# Patient Record
Sex: Male | Born: 1940 | Race: White | Hispanic: No | Marital: Married | Smoking: Current every day smoker
Health system: Southern US, Community
[De-identification: ages and names within clinical notes are randomized; demographics above are authoritative.]

## PROBLEM LIST (undated history)

## (undated) DIAGNOSIS — I213 ST elevation (STEMI) myocardial infarction of unspecified site: Secondary | ICD-10-CM

## (undated) DIAGNOSIS — I10 Essential (primary) hypertension: Secondary | ICD-10-CM

## (undated) DIAGNOSIS — J449 Chronic obstructive pulmonary disease, unspecified: Secondary | ICD-10-CM

## (undated) HISTORY — PX: CORONARY ARTERY BYPASS GRAFT: SHX141

## (undated) HISTORY — PX: CATARACT EXTRACTION: SUR2

---

## 2014-06-19 DIAGNOSIS — E7800 Pure hypercholesterolemia, unspecified: Secondary | ICD-10-CM | POA: Insufficient documentation

## 2014-06-19 DIAGNOSIS — I10 Essential (primary) hypertension: Secondary | ICD-10-CM | POA: Insufficient documentation

## 2017-09-14 DIAGNOSIS — Z720 Tobacco use: Secondary | ICD-10-CM | POA: Insufficient documentation

## 2017-12-28 DIAGNOSIS — J984 Other disorders of lung: Secondary | ICD-10-CM | POA: Insufficient documentation

## 2018-06-21 DIAGNOSIS — J449 Chronic obstructive pulmonary disease, unspecified: Secondary | ICD-10-CM | POA: Insufficient documentation

## 2019-12-18 ENCOUNTER — Inpatient Hospital Stay (HOSPITAL_COMMUNITY)
Admission: EM | Admit: 2019-12-18 | Discharge: 2019-12-21 | DRG: 065 | Disposition: A | Discharge status: Home Health | Payer: Medicare HMO | Attending: Internal Medicine | Admitting: Internal Medicine

## 2019-12-18 DIAGNOSIS — I639 Cerebral infarction, unspecified: Secondary | ICD-10-CM | POA: Diagnosis present

## 2019-12-18 DIAGNOSIS — I63512 Cerebral infarction due to unspecified occlusion or stenosis of left middle cerebral artery: Principal | ICD-10-CM | POA: Diagnosis present

## 2019-12-18 DIAGNOSIS — I2510 Atherosclerotic heart disease of native coronary artery without angina pectoris: Secondary | ICD-10-CM | POA: Diagnosis present

## 2019-12-18 DIAGNOSIS — J449 Chronic obstructive pulmonary disease, unspecified: Secondary | ICD-10-CM | POA: Diagnosis present

## 2019-12-18 DIAGNOSIS — I472 Ventricular tachycardia: Secondary | ICD-10-CM | POA: Diagnosis present

## 2019-12-18 DIAGNOSIS — Z79899 Other long term (current) drug therapy: Secondary | ICD-10-CM

## 2019-12-18 DIAGNOSIS — Z20828 Contact with and (suspected) exposure to other viral communicable diseases: Secondary | ICD-10-CM | POA: Diagnosis present

## 2019-12-18 DIAGNOSIS — I252 Old myocardial infarction: Secondary | ICD-10-CM

## 2019-12-18 DIAGNOSIS — Z6830 Body mass index (BMI) 30.0-30.9, adult: Secondary | ICD-10-CM

## 2019-12-18 DIAGNOSIS — I110 Hypertensive heart disease with heart failure: Secondary | ICD-10-CM | POA: Diagnosis present

## 2019-12-18 DIAGNOSIS — F17210 Nicotine dependence, cigarettes, uncomplicated: Secondary | ICD-10-CM | POA: Diagnosis present

## 2019-12-18 DIAGNOSIS — I10 Essential (primary) hypertension: Secondary | ICD-10-CM | POA: Diagnosis present

## 2019-12-18 DIAGNOSIS — R29707 NIHSS score 7: Secondary | ICD-10-CM | POA: Diagnosis present

## 2019-12-18 DIAGNOSIS — I255 Ischemic cardiomyopathy: Secondary | ICD-10-CM | POA: Diagnosis present

## 2019-12-18 DIAGNOSIS — E669 Obesity, unspecified: Secondary | ICD-10-CM | POA: Diagnosis present

## 2019-12-18 DIAGNOSIS — R4701 Aphasia: Secondary | ICD-10-CM | POA: Diagnosis present

## 2019-12-18 DIAGNOSIS — E785 Hyperlipidemia, unspecified: Secondary | ICD-10-CM | POA: Diagnosis present

## 2019-12-18 DIAGNOSIS — Z7951 Long term (current) use of inhaled steroids: Secondary | ICD-10-CM

## 2019-12-18 DIAGNOSIS — I5022 Chronic systolic (congestive) heart failure: Secondary | ICD-10-CM | POA: Diagnosis present

## 2019-12-18 DIAGNOSIS — Z951 Presence of aortocoronary bypass graft: Secondary | ICD-10-CM

## 2019-12-18 DIAGNOSIS — Z7982 Long term (current) use of aspirin: Secondary | ICD-10-CM

## 2019-12-18 HISTORY — DX: Essential (primary) hypertension: I10

## 2019-12-18 HISTORY — DX: ST elevation (STEMI) myocardial infarction of unspecified site: I21.3

## 2019-12-18 HISTORY — DX: Chronic obstructive pulmonary disease, unspecified: J44.9

## 2019-12-18 LAB — I-STAT CHEM 8, ED
BUN: 31 mg/dL — ABNORMAL HIGH (ref 8–23)
Calcium, Ion: 1.07 mmol/L — ABNORMAL LOW (ref 1.15–1.40)
Chloride: 104 mmol/L (ref 98–111)
Creatinine, Ser: 1.1 mg/dL (ref 0.61–1.24)
Glucose, Bld: 99 mg/dL (ref 70–99)
HCT: 51 % (ref 39.0–52.0)
Hemoglobin: 17.3 g/dL — ABNORMAL HIGH (ref 13.0–17.0)
Potassium: 4.4 mmol/L (ref 3.5–5.1)
Sodium: 136 mmol/L (ref 135–145)
TCO2: 25 mmol/L (ref 22–32)

## 2019-12-18 LAB — CBG MONITORING, ED: Glucose-Capillary: 100 mg/dL — ABNORMAL HIGH (ref 70–99)

## 2019-12-18 MED ORDER — SODIUM CHLORIDE 0.9% FLUSH
3.0000 mL | Freq: Once | INTRAVENOUS | Status: AC
  Administered 2019-12-19: 3 mL via INTRAVENOUS

## 2019-12-18 NOTE — Consult Note (Signed)
Referring Physician: Dr. Roxanne Mins    Chief Complaint: Acute onset of expressive and receptive aphasia  HPI: Raymond Vance is an 78 y.o. male presenting to the ED via EMS with acute onset of aphasia. LKN was 1500, when he started to exhibit slight confusion per wife. This evening, she noted that he was unable to speak and called EMS. On EMS arrival he was mute but could move all 4 extremities equally, had no facial droop and was able to ambulate to the stretcher. On arrival to the ED he continued to be mute; receptive aphasia was also noted.   Per his wife, he is not on a blood thinner. He has no history of stroke. He has a prior history of MI.    LSN: 1500 tPA Given: No: Out of time window   PMHx/PSHx HTN COPD MI CABG Cataract surgery approximately 2 weeks ago  All NKDA  SHx Probable smoker  FHx Unknown  Home Meds Albuterol HCTZ Lisinopril Metoprolol Omeprazole Spiriva Sprironalactone   ROS: Unable to obtain due to aphasia.   Physical Examination: There were no vitals taken for this visit.   HEENT: [place]/AT Lungs: Respirations unlabored Ext: No edema  Neurologic Examination: Mental Status: Awake and alert. Dense expressive aphasia - mute with no verbal output. Has severe receptive aphasia - comprehends about 20% of simple verbal commands, often requiring repetition. No hemineglect.  Cranial Nerves: II:  Blinks to threat temporal visual field of each eye. PERRL. III,IV, VI: No ptosis. EOMI without nystagmus.  V,VII: Smile symmetric, reacts to tactile stimulation bilaterally  VIII: hearing intact to some commands IX,X: Unable to visualize palate XI: Head is midline XII: midline tongue extension  Motor: Right : Upper extremity   5/5    Left:     Upper extremity   5/5  Lower extremity   5/5     Lower extremity   5/5 No pronator drift.  Sensory: Reacts to pinch in BUE. Reacts to plantar stimulation BLE.  Deep Tendon Reflexes:  2+ bilateral brachioradialis and  biceps Trace patellar reflexes bilaterally 0 achilles bilaterally  Toes downgoing bilaterally  Cerebellar: No ataxia with finger to nose bilaterally  Gait: Unable to assess  Results for orders placed or performed during the hospital encounter of 12/18/19 (from the past 48 hour(s))  CBG monitoring, ED     Status: Abnormal   Collection Time: 12/18/19 11:50 PM  Result Value Ref Range   Glucose-Capillary 100 (H) 70 - 99 mg/dL   No results found.  Assessment: 78 y.o. male presenting with acute onset of expressive > receptive aphasia.  1. 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 ASPECTS 8. 2. Exam reveals dense expressive aphasia, with severe receptive aphasia. No limb weakness, facial droop, visual field cut, hemineglect or sensory deficit noted.  3. Stroke Risk Factors - HTN and prior MI 4. CTA head/neck with no LVO 5. Not a tPA candidate due to time criteria. Not a VIR candidate as CTA is negative for LVO.   Plan: 1. HgbA1c, fasting lipid panel 2. MRI brain 3. PT consult, OT consult, Speech consult 4. Echocardiogram 5. Start ASA 81 mg po qd. Give 650 mg PO loading dose now. If unable to take PO give 600 mg ASA per rectum now.  6. Most likely risks of statin outweigh potential benefits, due to advanced age.  7. Permissive HTN x 24 hours. Given advanced age, use modified protocol with treatment if SBP > 200 8. Telemetry monitoring 9. Frequent neuro checks   @  Electronically signed: Dr. Caryl Pina 12/18/2019, 11:57 PM

## 2019-12-19 ENCOUNTER — Emergency Department (HOSPITAL_COMMUNITY): Payer: Medicare HMO

## 2019-12-19 ENCOUNTER — Other Ambulatory Visit: Payer: Self-pay

## 2019-12-19 ENCOUNTER — Inpatient Hospital Stay (HOSPITAL_COMMUNITY): Payer: Medicare HMO

## 2019-12-19 ENCOUNTER — Encounter (HOSPITAL_COMMUNITY): Payer: Self-pay

## 2019-12-19 ENCOUNTER — Other Ambulatory Visit (HOSPITAL_COMMUNITY): Payer: Self-pay

## 2019-12-19 DIAGNOSIS — J449 Chronic obstructive pulmonary disease, unspecified: Secondary | ICD-10-CM | POA: Diagnosis present

## 2019-12-19 DIAGNOSIS — I6389 Other cerebral infarction: Secondary | ICD-10-CM | POA: Diagnosis not present

## 2019-12-19 DIAGNOSIS — Z951 Presence of aortocoronary bypass graft: Secondary | ICD-10-CM | POA: Diagnosis not present

## 2019-12-19 DIAGNOSIS — E7800 Pure hypercholesterolemia, unspecified: Secondary | ICD-10-CM | POA: Diagnosis not present

## 2019-12-19 DIAGNOSIS — I2510 Atherosclerotic heart disease of native coronary artery without angina pectoris: Secondary | ICD-10-CM | POA: Diagnosis present

## 2019-12-19 DIAGNOSIS — I10 Essential (primary) hypertension: Secondary | ICD-10-CM | POA: Diagnosis not present

## 2019-12-19 DIAGNOSIS — Z6830 Body mass index (BMI) 30.0-30.9, adult: Secondary | ICD-10-CM | POA: Diagnosis not present

## 2019-12-19 DIAGNOSIS — I63512 Cerebral infarction due to unspecified occlusion or stenosis of left middle cerebral artery: Secondary | ICD-10-CM | POA: Diagnosis present

## 2019-12-19 DIAGNOSIS — I5022 Chronic systolic (congestive) heart failure: Secondary | ICD-10-CM | POA: Diagnosis present

## 2019-12-19 DIAGNOSIS — I110 Hypertensive heart disease with heart failure: Secondary | ICD-10-CM | POA: Diagnosis present

## 2019-12-19 DIAGNOSIS — Z20828 Contact with and (suspected) exposure to other viral communicable diseases: Secondary | ICD-10-CM | POA: Diagnosis present

## 2019-12-19 DIAGNOSIS — I491 Atrial premature depolarization: Secondary | ICD-10-CM | POA: Diagnosis not present

## 2019-12-19 DIAGNOSIS — I255 Ischemic cardiomyopathy: Secondary | ICD-10-CM | POA: Diagnosis present

## 2019-12-19 DIAGNOSIS — R29707 NIHSS score 7: Secondary | ICD-10-CM | POA: Diagnosis present

## 2019-12-19 DIAGNOSIS — I63412 Cerebral infarction due to embolism of left middle cerebral artery: Secondary | ICD-10-CM | POA: Diagnosis not present

## 2019-12-19 DIAGNOSIS — E785 Hyperlipidemia, unspecified: Secondary | ICD-10-CM | POA: Diagnosis present

## 2019-12-19 DIAGNOSIS — I472 Ventricular tachycardia: Secondary | ICD-10-CM | POA: Diagnosis present

## 2019-12-19 DIAGNOSIS — F17210 Nicotine dependence, cigarettes, uncomplicated: Secondary | ICD-10-CM | POA: Diagnosis present

## 2019-12-19 DIAGNOSIS — I252 Old myocardial infarction: Secondary | ICD-10-CM | POA: Diagnosis not present

## 2019-12-19 DIAGNOSIS — Z79899 Other long term (current) drug therapy: Secondary | ICD-10-CM | POA: Diagnosis not present

## 2019-12-19 DIAGNOSIS — I639 Cerebral infarction, unspecified: Secondary | ICD-10-CM | POA: Diagnosis present

## 2019-12-19 DIAGNOSIS — R4701 Aphasia: Secondary | ICD-10-CM | POA: Diagnosis present

## 2019-12-19 DIAGNOSIS — I493 Ventricular premature depolarization: Secondary | ICD-10-CM | POA: Diagnosis not present

## 2019-12-19 DIAGNOSIS — Z7982 Long term (current) use of aspirin: Secondary | ICD-10-CM | POA: Diagnosis not present

## 2019-12-19 DIAGNOSIS — Z7951 Long term (current) use of inhaled steroids: Secondary | ICD-10-CM | POA: Diagnosis not present

## 2019-12-19 DIAGNOSIS — E669 Obesity, unspecified: Secondary | ICD-10-CM | POA: Diagnosis present

## 2019-12-19 LAB — ECHOCARDIOGRAM COMPLETE
Height: 71 in
Weight: 3449.76 oz

## 2019-12-19 LAB — LIPID PANEL
Cholesterol: 179 mg/dL (ref 0–200)
HDL: 50 mg/dL (ref >40)
LDL Cholesterol: 112 mg/dL — ABNORMAL HIGH (ref 0–99)
Total CHOL/HDL Ratio: 3.6 RATIO
Triglycerides: 87 mg/dL (ref <150)
VLDL: 17 mg/dL (ref 0–40)

## 2019-12-19 LAB — CBC
HCT: 51.7 % (ref 39.0–52.0)
Hemoglobin: 16.9 g/dL (ref 13.0–17.0)
MCH: 30.5 pg (ref 26.0–34.0)
MCHC: 32.7 g/dL (ref 30.0–36.0)
MCV: 93.2 fL (ref 80.0–100.0)
Platelets: 169 10*3/uL (ref 150–400)
RBC: 5.55 MIL/uL (ref 4.22–5.81)
RDW: 13.4 % (ref 11.5–15.5)
WBC: 7.5 10*3/uL (ref 4.0–10.5)
nRBC: 0 % (ref 0.0–0.2)

## 2019-12-19 LAB — DIFFERENTIAL
Abs Immature Granulocytes: 0.02 10*3/uL (ref 0.00–0.07)
Basophils Absolute: 0.1 10*3/uL (ref 0.0–0.1)
Basophils Relative: 1 %
Eosinophils Absolute: 0.2 10*3/uL (ref 0.0–0.5)
Eosinophils Relative: 3 %
Immature Granulocytes: 0 %
Lymphocytes Relative: 33 %
Lymphs Abs: 2.4 10*3/uL (ref 0.7–4.0)
Monocytes Absolute: 0.6 10*3/uL (ref 0.1–1.0)
Monocytes Relative: 8 %
Neutro Abs: 4.2 10*3/uL (ref 1.7–7.7)
Neutrophils Relative %: 55 %

## 2019-12-19 LAB — COMPREHENSIVE METABOLIC PANEL
ALT: 19 U/L (ref 0–44)
AST: 16 U/L (ref 15–41)
Albumin: 3.8 g/dL (ref 3.5–5.0)
Alkaline Phosphatase: 50 U/L (ref 38–126)
Anion gap: 12 (ref 5–15)
BUN: 28 mg/dL — ABNORMAL HIGH (ref 8–23)
CO2: 21 mmol/L — ABNORMAL LOW (ref 22–32)
Calcium: 9.1 mg/dL (ref 8.9–10.3)
Chloride: 103 mmol/L (ref 98–111)
Creatinine, Ser: 1.24 mg/dL (ref 0.61–1.24)
GFR calc Af Amer: >60 mL/min (ref >60)
GFR calc non Af Amer: 55 mL/min — ABNORMAL LOW (ref >60)
Glucose, Bld: 103 mg/dL — ABNORMAL HIGH (ref 70–99)
Potassium: 4.5 mmol/L (ref 3.5–5.1)
Sodium: 136 mmol/L (ref 135–145)
Total Bilirubin: 0.7 mg/dL (ref 0.3–1.2)
Total Protein: 6.5 g/dL (ref 6.5–8.1)

## 2019-12-19 LAB — PROTIME-INR
INR: 1 (ref 0.8–1.2)
Prothrombin Time: 12.5 seconds (ref 11.4–15.2)

## 2019-12-19 LAB — RESPIRATORY PANEL BY RT PCR (FLU A&B, COVID)
Influenza A by PCR: NEGATIVE
Influenza B by PCR: NEGATIVE
SARS Coronavirus 2 by RT PCR: NEGATIVE

## 2019-12-19 LAB — APTT: aPTT: 25 seconds (ref 24–36)

## 2019-12-19 LAB — HEMOGLOBIN A1C
Hgb A1c MFr Bld: 6.2 % — ABNORMAL HIGH (ref 4.8–5.6)
Mean Plasma Glucose: 131.24 mg/dL

## 2019-12-19 MED ORDER — CHLORHEXIDINE GLUCONATE 0.12 % MT SOLN
15.0000 mL | Freq: Two times a day (BID) | OROMUCOSAL | Status: DC
  Administered 2019-12-19 – 2019-12-21 (×5): 15 mL via OROMUCOSAL
  Filled 2019-12-19 (×5): qty 15

## 2019-12-19 MED ORDER — ORAL CARE MOUTH RINSE: 15.0000 mL | Freq: Two times a day (BID) | OROMUCOSAL | Status: DC

## 2019-12-19 MED ORDER — ASPIRIN 300 MG RE SUPP
600.0000 mg | Freq: Once | RECTAL | Status: AC
  Administered 2019-12-19: 600 mg via RECTAL
  Filled 2019-12-19: qty 2

## 2019-12-19 MED ORDER — PERFLUTREN LIPID MICROSPHERE
INTRAVENOUS | Status: AC
  Filled 2019-12-19: qty 10

## 2019-12-19 MED ORDER — IOHEXOL 350 MG/ML SOLN
100.0000 mL | Freq: Once | INTRAVENOUS | Status: AC | PRN
  Administered 2019-12-19: 100 mL via INTRAVENOUS

## 2019-12-19 MED ORDER — ACETAMINOPHEN 325 MG PO TABS: 650.0000 mg | ORAL_TABLET | ORAL | Status: DC | PRN

## 2019-12-19 MED ORDER — ACETAMINOPHEN 650 MG RE SUPP: 650.0000 mg | RECTAL | Status: DC | PRN

## 2019-12-19 MED ORDER — PERFLUTREN LIPID MICROSPHERE
1.0000 mL | INTRAVENOUS | Status: AC | PRN
  Administered 2019-12-19: 2 mL via INTRAVENOUS
  Filled 2019-12-19: qty 10

## 2019-12-19 MED ORDER — SODIUM CHLORIDE 0.9 % IV SOLN: INTRAVENOUS | Status: DC

## 2019-12-19 MED ORDER — ACETAMINOPHEN 160 MG/5ML PO SOLN: 650.0000 mg | ORAL | Status: DC | PRN

## 2019-12-19 MED ORDER — SODIUM CHLORIDE 0.9 % IV SOLN: Freq: Once | INTRAVENOUS | Status: AC

## 2019-12-19 MED ORDER — ORAL CARE MOUTH RINSE
15.0000 mL | Freq: Two times a day (BID) | OROMUCOSAL | Status: DC
  Administered 2019-12-19 – 2019-12-21 (×5): 15 mL via OROMUCOSAL

## 2019-12-19 MED ORDER — ASPIRIN EC 325 MG PO TBEC
325.0000 mg | DELAYED_RELEASE_TABLET | Freq: Every day | ORAL | Status: DC
  Administered 2019-12-20 – 2019-12-21 (×2): 325 mg via ORAL
  Filled 2019-12-19 (×2): qty 1

## 2019-12-19 MED ORDER — STROKE: EARLY STAGES OF RECOVERY BOOK: Freq: Once | Status: AC

## 2019-12-19 MED ORDER — STROKE: EARLY STAGES OF RECOVERY BOOK
Status: AC
  Filled 2019-12-19: qty 1

## 2019-12-19 MED ORDER — ENOXAPARIN SODIUM 40 MG/0.4ML ~~LOC~~ SOLN
40.0000 mg | SUBCUTANEOUS | Status: DC
  Administered 2019-12-19 – 2019-12-21 (×3): 40 mg via SUBCUTANEOUS
  Filled 2019-12-19 (×3): qty 0.4

## 2019-12-19 MED ORDER — ASPIRIN EC 81 MG PO TBEC: 81.0000 mg | DELAYED_RELEASE_TABLET | Freq: Every day | ORAL | Status: DC

## 2019-12-19 MED ORDER — ATORVASTATIN CALCIUM 40 MG PO TABS
40.0000 mg | ORAL_TABLET | Freq: Every day | ORAL | Status: DC
  Administered 2019-12-19 – 2019-12-20 (×2): 40 mg via ORAL
  Filled 2019-12-19 (×2): qty 1

## 2019-12-19 NOTE — Progress Notes (Signed)
  Echocardiogram 2D Echocardiogram has been performed.  Bobbye Charleston 12/19/2019, 11:45 AM

## 2019-12-19 NOTE — Progress Notes (Signed)
PROGRESS NOTE    Raymond Vance  ZOX:[id number] DOB: 10-18-1941 DOA: 12/18/2019 PCP: Wilburn Mylar, MD   Brief Narrative: Raymond Vance is a 78 y.o. male with medical history significant of HTN, CAD s/p CABG. Patient presented secondary to aphasia and found to have an acute stroke.   Assessment & Plan:   Principal Problem:   Acute ischemic stroke [facility]) Active Problems:   HTN (hypertension)   CAD (coronary artery disease)   Severe aphasia   Acute CVA Resultant severe aphasia. CT head significant for acute/early subacute ischemia of anterior left MCA distribution. MRI significant for intermediate sized acute infarct of left frontal operculum. LDL of 112, hemoglobin A1C of 6.2, Transthoracic Echocardiogram pending. Patient started on aspirin. Neurology consulted. OT recommending CIR -Neurology recommendations: pending -Continue aspirin -PT/SLP pending -Transthoracic Echocardiogram -IV fluids  Essential hypertension Will await completion of medication reconciliation  CAD Hyperlipidemia Medication reconciliation pending   DVT prophylaxis: Lovenox Code Status:   Code Status: Full Code Family Communication: None Disposition Plan: Discharge pending stroke workup and therapy recommendations   Consultants:   Neurology  Procedures:   None  Antimicrobials:  None    Subjective: Aphasic. Patient shakes his head and nods. No questions. No weakness  Objective: Vitals:   12/19/19 0852 12/19/19 1029 12/19/19 1051 12/19/19 1157  BP: 111/60 120/63 133/69 122/65  Pulse:  (!) 57 (!) 54 (!) 57  Resp: 19 (!) 21 18 20   Temp:      TempSrc:      SpO2:  97% 96% 97%  Weight:      Height:        Intake/Output Summary (Last 24 hours) at 12/19/2019 1214 Last data filed at 12/19/2019 0600 Gross per 24 hour  Intake 562.45 ml  Output 400 ml  Net 162.45 ml   Filed Weights   12/19/19 0108 12/19/19 0359  Weight: 99.2 kg 97.8 kg    Examination:  General exam: Appears  calm and comfortable Respiratory system: Clear to auscultation. Respiratory effort normal. Cardiovascular system: S1 & S2 heard, RRR. No murmurs, rubs, gallops or clicks. Gastrointestinal system: Abdomen is nondistended, soft and nontender. No organomegaly or masses felt. Normal bowel sounds heard. Central nervous system: Alert and oriented. Upper and lower extremity weakness bilaterally. Expressive aphasia Extremities: No edema. No calf tenderness Skin: No cyanosis. No rashes Psychiatry: Judgement and insight appear normal. Mood & affect appropriate.    Data Reviewed: I have personally reviewed following labs and imaging studies  CBC: Recent Labs  Lab 12/18/19 2351 12/18/19 2356  WBC 7.5  --   NEUTROABS 4.2  --   HGB 16.9 17.3*  HCT 51.7 51.0  MCV 93.2  --   PLT 169  --    Basic Metabolic Panel: Recent Labs  Lab 12/18/19 2351 12/18/19 2356  NA 136 136  K 4.5 4.4  CL 103 104  CO2 21*  --   GLUCOSE 103* 99  BUN 28* 31*  CREATININE 1.24 1.10  CALCIUM 9.1  --    GFR: Estimated Creatinine Clearance: 64.9 mL/min (by C-G formula based on SCr of 1.1 mg/dL). Liver Function Tests: Recent Labs  Lab 12/18/19 2351  AST 16  ALT 19  ALKPHOS 50  BILITOT 0.7  PROT 6.5  ALBUMIN 3.8   No results for input(s): LIPASE, AMYLASE in the last 168 hours. No results for input(s): AMMONIA in the last 168 hours. Coagulation Profile: Recent Labs  Lab 12/18/19 2351  INR 1.0   Cardiac Enzymes: No results for  input(s): CKTOTAL, CKMB, CKMBINDEX, TROPONINI in the last 168 hours. BNP (last 3 results) No results for input(s): PROBNP in the last 8760 hours. HbA1C: Recent Labs    12/19/19 0332  HGBA1C 6.2*   CBG: Recent Labs  Lab 12/18/19 2350  GLUCAP 100*   Lipid Profile: Recent Labs    12/19/19 0332  CHOL 179  HDL 50  LDLCALC 112*  TRIG 87  CHOLHDL 3.6   Thyroid Function Tests: No results for input(s): TSH, T4TOTAL, FREET4, T3FREE, THYROIDAB in the last 72  hours. Anemia Panel: No results for input(s): VITAMINB12, FOLATE, FERRITIN, TIBC, IRON, RETICCTPCT in the last 72 hours. Sepsis Labs: No results for input(s): PROCALCITON, LATICACIDVEN in the last 168 hours.  Recent Results (from the past 240 hour(s))  Respiratory Panel by RT PCR (Flu A&B, Covid) - Nasopharyngeal Swab     Status: None   Collection Time: 12/19/19 12:44 AM   Specimen: Nasopharyngeal Swab  Result Value Ref Range Status   SARS Coronavirus 2 by RT PCR NEGATIVE NEGATIVE Final    Comment: (NOTE) SARS-CoV-2 target nucleic acids are NOT DETECTED. The SARS-CoV-2 RNA is generally detectable in upper respiratoy specimens during the acute phase of infection. The lowest concentration of SARS-CoV-2 viral copies this assay can detect is 131 copies/mL. A negative result does not preclude SARS-Cov-2 infection and should not be used as the sole basis for treatment or other patient management decisions. A negative result may occur with  improper specimen collection/handling, submission of specimen other than nasopharyngeal swab, presence of viral mutation(s) within the areas targeted by this assay, and inadequate number of viral copies (<131 copies/mL). A negative result must be combined with clinical observations, patient history, and epidemiological information. The expected result is Negative. Fact Sheet for Patients:  https://www.moore.com/ Fact Sheet for Healthcare Providers:  https://www.young.biz/ This test is not yet ap proved or cleared by the [place] FDA and  has been authorized for detection and/or diagnosis of SARS-CoV-2 by FDA under an Emergency Use Authorization (EUA). This EUA will remain  in effect (meaning this test can be used) for the duration of the COVID-19 declaration under Section 564(b)(1) of the Act, 21 U.S.C. section 360bbb-3(b)(1), unless the authorization is terminated or revoked sooner.    Influenza A by PCR  NEGATIVE NEGATIVE Final   Influenza B by PCR NEGATIVE NEGATIVE Final    Comment: (NOTE) The Xpert Xpress SARS-CoV-2/FLU/RSV assay is intended as an aid in  the diagnosis of influenza from Nasopharyngeal swab specimens and  should not be used as a sole basis for treatment. Nasal washings and  aspirates are unacceptable for Xpert Xpress SARS-CoV-2/FLU/RSV  testing. Fact Sheet for Patients: https://www.moore.com/ Fact Sheet for Healthcare Providers: https://www.young.biz/ This test is not yet approved or cleared by the [place] FDA and  has been authorized for detection and/or diagnosis of SARS-CoV-2 by  FDA under an Emergency Use Authorization (EUA). This EUA will remain  in effect (meaning this test can be used) for the duration of the  Covid-19 declaration under Section 564(b)(1) of the Act, 21  U.S.C. section 360bbb-3(b)(1), unless the authorization is  terminated or revoked. Performed at [facility], [address], [place], [place] [address]          Radiology Studies: CT Code Stroke CTA Head W/WO contrast  Result Date: 12/19/2019 CLINICAL DATA:  Aphasia EXAM: CT ANGIOGRAPHY HEAD AND NECK CT PERFUSION BRAIN TECHNIQUE: Multidetector CT imaging of the head and neck was performed using the standard protocol  during bolus administration of intravenous contrast. Multiplanar CT image reconstructions and MIPs were obtained to evaluate the vascular anatomy. Carotid stenosis measurements (when applicable) are obtained utilizing NASCET criteria, using the distal internal carotid diameter as the denominator. Multiphase CT imaging of the brain was performed following IV bolus contrast injection. Subsequent parametric perfusion maps were calculated using RAPID software. CONTRAST:  OMNIPAQUE IOHEXOL 350 MG/ML SOLN COMPARISON:  None. FINDINGS: CTA NECK FINDINGS SKELETON: There is no bony spinal canal stenosis. No lytic or blastic lesion.  OTHER NECK: Normal pharynx, larynx and major salivary glands. No cervical lymphadenopathy. Unremarkable thyroid gland. UPPER CHEST: No pneumothorax or pleural effusion. No nodules or masses. AORTIC ARCH: There is no calcific atherosclerosis of the aortic arch. There is no aneurysm, dissection or hemodynamically significant stenosis of the visualized portion of the aorta. Conventional 3 vessel aortic branching pattern. The visualized proximal subclavian arteries are widely patent. RIGHT CAROTID SYSTEM: No dissection, occlusion or aneurysm. Mild atherosclerotic calcification at the carotid bifurcation without hemodynamically significant stenosis. LEFT CAROTID SYSTEM: No dissection, occlusion or aneurysm. Mild atherosclerotic calcification at the carotid bifurcation without hemodynamically significant stenosis. VERTEBRAL ARTERIES: Left dominant configuration. Moderate calcification of the left vertebral artery origin. There is no dissection, occlusion or flow-limiting stenosis to the skull base (V1-V3 segments). CTA HEAD FINDINGS POSTERIOR CIRCULATION: --Vertebral arteries: Bilateral atherosclerotic calcification of the V4 segments with severe stenosis on the right. No stenosis on the left. --Posterior inferior cerebellar arteries (PICA): Patent origins from the vertebral arteries. --Anterior inferior cerebellar arteries (AICA): Patent origins from the basilar artery. --Basilar artery: Normal. --Superior cerebellar arteries: Normal. --Posterior cerebral arteries: Normal. Both originate from the basilar artery. Posterior communicating arteries (p-comm) are diminutive or absent. ANTERIOR CIRCULATION: --Intracranial internal carotid arteries: Atherosclerotic calcification of the internal carotid arteries at the skull base without hemodynamically significant stenosis. --Anterior cerebral arteries (ACA): Normal. Both A1 segments are present. Patent anterior communicating artery (a-comm). --Middle cerebral arteries (MCA):  There is moderate stenosis of the distal right MCA M1 segment. There is no proximal occlusion. There is mild narrowing of the distal left M1 segment. The M2 branches are patent. There is tapering of an M3 branch in the area of the frontal operculum infarct. VENOUS SINUSES: As permitted by contrast timing, patent. ANATOMIC VARIANTS: None Review of the MIP images confirms the above findings. CT Brain Perfusion Findings: ASPECTS: 8 CBF (<30%) Volume: 11mL Perfusion (Tmax>6.0s) volume: 36mL Mismatch Volume: 25mL Infarction Location:Left frontal operculum IMPRESSION: 1. No emergent large vessel occlusion. 2. 11 mL core infarct in the left frontal operculum with surrounding 25 mL ischemic penumbra. 3. Mild narrowing of the distal left M1 segment with tapering of an M3 branch in the area of the frontal operculum infarct. 4. 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 5. Mild bilateral carotid bifurcation atherosclerosis without hemodynamically significant stenosis. 6. Moderate stenosis of the distal right MCA M1 segment. These results were called by telephone at the time of interpretation on 12/19/2019 at 12:30 am to provider ERIC [facility] , who verbally acknowledged these results. Electronically Signed   By: Deatra Robinson M.D.   On: 12/19/2019 00:35   DG Chest 2 View  Result Date: 12/19/2019 CLINICAL DATA:  Infarct EXAM: CHEST - 2 VIEW COMPARISON:  None. FINDINGS: Mild cardiomegaly and remote median sternotomy. No focal airspace consolidation or pulmonary edema. No pleural effusion or pneumothorax. IMPRESSION: No active cardiopulmonary disease. Electronically Signed   By: Deatra Robinson M.D.   On: 12/19/2019 03:00  CT Code Stroke CTA Neck W/WO contrast  Result Date: 12/19/2019 CLINICAL DATA:  Aphasia EXAM: CT ANGIOGRAPHY HEAD AND NECK CT PERFUSION BRAIN TECHNIQUE: Multidetector CT imaging of the head and neck was performed using the standard protocol during bolus administration of intravenous  contrast. Multiplanar CT image reconstructions and MIPs were obtained to evaluate the vascular anatomy. Carotid stenosis measurements (when applicable) are obtained utilizing NASCET criteria, using the distal internal carotid diameter as the denominator. Multiphase CT imaging of the brain was performed following IV bolus contrast injection. Subsequent parametric perfusion maps were calculated using RAPID software. CONTRAST:  OMNIPAQUE IOHEXOL 350 MG/ML SOLN COMPARISON:  None. FINDINGS: CTA NECK FINDINGS SKELETON: There is no bony spinal canal stenosis. No lytic or blastic lesion. OTHER NECK: Normal pharynx, larynx and major salivary glands. No cervical lymphadenopathy. Unremarkable thyroid gland. UPPER CHEST: No pneumothorax or pleural effusion. No nodules or masses. AORTIC ARCH: There is no calcific atherosclerosis of the aortic arch. There is no aneurysm, dissection or hemodynamically significant stenosis of the visualized portion of the aorta. Conventional 3 vessel aortic branching pattern. The visualized proximal subclavian arteries are widely patent. RIGHT CAROTID SYSTEM: No dissection, occlusion or aneurysm. Mild atherosclerotic calcification at the carotid bifurcation without hemodynamically significant stenosis. LEFT CAROTID SYSTEM: No dissection, occlusion or aneurysm. Mild atherosclerotic calcification at the carotid bifurcation without hemodynamically significant stenosis. VERTEBRAL ARTERIES: Left dominant configuration. Moderate calcification of the left vertebral artery origin. There is no dissection, occlusion or flow-limiting stenosis to the skull base (V1-V3 segments). CTA HEAD FINDINGS POSTERIOR CIRCULATION: --Vertebral arteries: Bilateral atherosclerotic calcification of the V4 segments with severe stenosis on the right. No stenosis on the left. --Posterior inferior cerebellar arteries (PICA): Patent origins from the vertebral arteries. --Anterior inferior cerebellar arteries (AICA): Patent  origins from the basilar artery. --Basilar artery: Normal. --Superior cerebellar arteries: Normal. --Posterior cerebral arteries: Normal. Both originate from the basilar artery. Posterior communicating arteries (p-comm) are diminutive or absent. ANTERIOR CIRCULATION: --Intracranial internal carotid arteries: Atherosclerotic calcification of the internal carotid arteries at the skull base without hemodynamically significant stenosis. --Anterior cerebral arteries (ACA): Normal. Both A1 segments are present. Patent anterior communicating artery (a-comm). --Middle cerebral arteries (MCA): There is moderate stenosis of the distal right MCA M1 segment. There is no proximal occlusion. There is mild narrowing of the distal left M1 segment. The M2 branches are patent. There is tapering of an M3 branch in the area of the frontal operculum infarct. VENOUS SINUSES: As permitted by contrast timing, patent. ANATOMIC VARIANTS: None Review of the MIP images confirms the above findings. CT Brain Perfusion Findings: ASPECTS: 8 CBF (<30%) Volume: 11mL Perfusion (Tmax>6.0s) volume: 36mL Mismatch Volume: 25mL Infarction Location:Left frontal operculum IMPRESSION: 1. No emergent large vessel occlusion. 2. 11 mL core infarct in the left frontal operculum with surrounding 25 mL ischemic penumbra. 3. Mild narrowing of the distal left M1 segment with tapering of an M3 branch in the area of the frontal operculum infarct. 4. 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 5. Mild bilateral carotid bifurcation atherosclerosis without hemodynamically significant stenosis. 6. Moderate stenosis of the distal right MCA M1 segment. These results were called by telephone at the time of interpretation on 12/19/2019 at 12:30 am to provider ERIC [facility] , who verbally acknowledged these results. Electronically Signed   By: Deatra Robinson M.D.   On: 12/19/2019 00:35   MR BRAIN WO CONTRAST  Result Date: 12/19/2019 CLINICAL DATA:   Acute onset of aphasia. EXAM: MRI HEAD WITHOUT  CONTRAST TECHNIQUE: Multiplanar, multiecho pulse sequences of the brain and surrounding structures were obtained without intravenous contrast. COMPARISON:  Head CT 12/19/2019 FINDINGS: BRAIN: Intermediate sized acute infarct of the left frontal operculum. Early confluent hyperintense T2-weighted signal of the periventricular and deep white matter, most commonly due to chronic ischemic microangiopathy. 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 The cerebral and cerebellar volume are age-appropriate. There is no hydrocephalus. The midline structures are normal. VASCULAR: The major intracranial arterial and venous sinus flow voids are normal. Hemosiderin deposition at the site of old left parietal infarct. No acute hemorrhage. SKULL AND UPPER CERVICAL SPINE: Calvarial bone marrow signal is normal. There is no skull base mass. The visualized upper cervical spine and soft tissues are normal. SINUSES/ORBITS: Mild left maxillary sinus mucosal thickening. The orbits are normal. IMPRESSION: 1. Intermediate sized acute infarct of the left frontal operculum. No hemorrhage or mass effect. 2. 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 3. Moderate chronic small vessel disease. Electronically Signed   By: Deatra Robinson M.D.   On: 12/19/2019 02:49   CT Code Stroke Cerebral Perfusion with contrast  Result Date: 12/19/2019 CLINICAL DATA:  Aphasia EXAM: CT ANGIOGRAPHY HEAD AND NECK CT PERFUSION BRAIN TECHNIQUE: Multidetector CT imaging of the head and neck was performed using the standard protocol during bolus administration of intravenous contrast. Multiplanar CT image reconstructions and MIPs were obtained to evaluate the vascular anatomy. Carotid stenosis measurements (when applicable) are obtained utilizing NASCET criteria, using the distal internal carotid diameter as the denominator. Multiphase CT imaging of the brain was performed following IV  bolus contrast injection. Subsequent parametric perfusion maps were calculated using RAPID software. CONTRAST:  OMNIPAQUE IOHEXOL 350 MG/ML SOLN COMPARISON:  None. FINDINGS: CTA NECK FINDINGS SKELETON: There is no bony spinal canal stenosis. No lytic or blastic lesion. OTHER NECK: Normal pharynx, larynx and major salivary glands. No cervical lymphadenopathy. Unremarkable thyroid gland. UPPER CHEST: No pneumothorax or pleural effusion. No nodules or masses. AORTIC ARCH: There is no calcific atherosclerosis of the aortic arch. There is no aneurysm, dissection or hemodynamically significant stenosis of the visualized portion of the aorta. Conventional 3 vessel aortic branching pattern. The visualized proximal subclavian arteries are widely patent. RIGHT CAROTID SYSTEM: No dissection, occlusion or aneurysm. Mild atherosclerotic calcification at the carotid bifurcation without hemodynamically significant stenosis. LEFT CAROTID SYSTEM: No dissection, occlusion or aneurysm. Mild atherosclerotic calcification at the carotid bifurcation without hemodynamically significant stenosis. VERTEBRAL ARTERIES: Left dominant configuration. Moderate calcification of the left vertebral artery origin. There is no dissection, occlusion or flow-limiting stenosis to the skull base (V1-V3 segments). CTA HEAD FINDINGS POSTERIOR CIRCULATION: --Vertebral arteries: Bilateral atherosclerotic calcification of the V4 segments with severe stenosis on the right. No stenosis on the left. --Posterior inferior cerebellar arteries (PICA): Patent origins from the vertebral arteries. --Anterior inferior cerebellar arteries (AICA): Patent origins from the basilar artery. --Basilar artery: Normal. --Superior cerebellar arteries: Normal. --Posterior cerebral arteries: Normal. Both originate from the basilar artery. Posterior communicating arteries (p-comm) are diminutive or absent. ANTERIOR CIRCULATION: --Intracranial internal carotid arteries:  Atherosclerotic calcification of the internal carotid arteries at the skull base without hemodynamically significant stenosis. --Anterior cerebral arteries (ACA): Normal. Both A1 segments are present. Patent anterior communicating artery (a-comm). --Middle cerebral arteries (MCA): There is moderate stenosis of the distal right MCA M1 segment. There is no proximal occlusion. There is mild narrowing of the distal left M1 segment. The M2 branches are patent. There is tapering of an M3 branch in  the area of the frontal operculum infarct. VENOUS SINUSES: As permitted by contrast timing, patent. ANATOMIC VARIANTS: None Review of the MIP images confirms the above findings. CT Brain Perfusion Findings: ASPECTS: 8 CBF (<30%) Volume: 68mL Perfusion (Tmax>6.0s) volume: 73mL Mismatch Volume: 32mL Infarction Location:Left frontal operculum IMPRESSION: 1. No emergent large vessel occlusion. 2. 11 mL core infarct in the left frontal operculum with surrounding 25 mL ischemic penumbra. 3. Mild narrowing of the distal left M1 segment with tapering of an M3 branch in the area of the frontal operculum infarct. 4. 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 5. Mild bilateral carotid bifurcation atherosclerosis without hemodynamically significant stenosis. 6. Moderate stenosis of the distal right MCA M1 segment. These results were called by telephone at the time of interpretation on 12/19/2019 at 12:30 am to provider ERIC [facility] , who verbally acknowledged these results. Electronically Signed   By: Ulyses Jarred M.D.   On: 12/19/2019 00:35   ECHOCARDIOGRAM COMPLETE  Result Date: 12/19/2019   ECHOCARDIOGRAM REPORT   Patient Name:   Raymond Vance Date of Exam: 12/19/2019 Medical Rec #:  [id number]    Height:       71.0 in Accession #:    [id number]   Weight:       215.6 lb Date of Birth:  1941-10-16    BSA:          2.18 m Patient Age:    35 years     BP:           118/64 mmHg Patient Gender: M            HR:            37 bpm. Exam Location:  Inpatient Procedure: 2D Echo, Cardiac Doppler, Color Doppler and Intracardiac            Opacification Agent Indications:    Stroke  History:        Patient has no prior history of Echocardiogram examinations. CAD                 and Previous Myocardial Infarction, Prior CABG and Abnormal ECG,                 Stroke and COPD; Risk Factors:Hypertension.  Sonographer:    Roseanna Rainbow RDCS Referring Phys: [phone] JARED M GARDNER  Sonographer Comments: Technically difficult study due to poor echo windows, no subcostal window, suboptimal subcostal window and suboptimal apical window. IMPRESSIONS  1. Left ventricular ejection fraction, by visual estimation, is 20 to 25%. The left ventricle has severely decreased function. There is moderately increased left ventricular hypertrophy.  2. Definity contrast agent was given IV to delineate the left ventricular endocardial borders. No mural thrombus was noted.  3. Left ventricular diastolic function could not be evaluated.  4. The left ventricle demonstrates global hypokinesis.  5. Global right ventricle has normal systolic function.The right ventricular size is normal. No increase in right ventricular wall thickness.  6. Left atrial size was moderately dilated.  7. Right atrial size was mildly dilated.  8. The mitral valve is grossly normal. Trivial mitral valve regurgitation.  9. The tricuspid valve is grossly normal. 10. The aortic valve is tricuspid. Aortic valve regurgitation is trivial. Mild aortic valve sclerosis without stenosis. 11. The pulmonic valve was not well visualized. Pulmonic valve regurgitation is not visualized. 12. Aortic dilatation noted. 13. 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 FINDINGS  Left Ventricle:  Left ventricular ejection fraction, by visual estimation, is 20 to 25%. The left ventricle has severely decreased function. Definity contrast agent was given IV to delineate the left ventricular endocardial  borders. The left ventricle demonstrates global hypokinesis. There is moderately increased left ventricular hypertrophy. The left ventricular diastology could not be evaluated due to indeterminate diastolic function. Left ventricular diastolic function could not be evaluated. Right Ventricle: The right ventricular size is normal. No increase in right ventricular wall thickness. Global RV systolic function is has normal systolic function. Left Atrium: Left atrial size was moderately dilated. Right Atrium: Right atrial size was mildly dilated Pericardium: There is no evidence of pericardial effusion. Mitral Valve: The mitral valve is grossly normal. There is mild thickening of the mitral valve leaflet(s). Trivial mitral valve regurgitation. Tricuspid Valve: The tricuspid valve is grossly normal. Tricuspid valve regurgitation is trivial. Aortic Valve: The aortic valve is tricuspid. Aortic valve regurgitation is trivial. Mild aortic valve sclerosis is present, with no evidence of aortic valve stenosis. Aortic valve mean gradient measures 7.5 mmHg. Aortic valve peak gradient measures 15.4 mmHg. Aortic valve area, by VTI measures 4.38 cm. Pulmonic Valve: The pulmonic valve was not well visualized. Pulmonic valve regurgitation is not visualized. Pulmonic regurgitation is not visualized. Aorta: Aortic dilatation noted. 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 Venous: The inferior vena cava was not well visualized. IAS/Shunts: No atrial level shunt detected by color flow Doppler.  LEFT VENTRICLE PLAX 2D LVIDd:         6.60 cm LVIDs:         5.90 cm LV PW:         1.50 cm LV IVS:        1.60 cm LVOT diam:     2.70 cm LV SV:         50 ml LV SV Index:   22.53 LVOT Area:     5.73 cm  LV Volumes (MOD) LV area d, A4C:    52.90 cm LV area s, A4C:    44.30 cm LV major d, A4C:   9.50 cm LV major s, A4C:   8.62 cm LV vol d, MOD A4C: 226.0 ml LV vol s, MOD A4C: 175.0 ml LV SV MOD A4C:     226.0 ml RIGHT  VENTRICLE         IVC TAPSE (M-mode): 1.7 cm  IVC diam: 2.70 cm LEFT ATRIUM              Index       RIGHT ATRIUM           Index LA diam:        4.10 cm  1.88 cm/m  RA Area:     23.60 cm LA Vol (A2C):   104.0 ml 47.77 ml/m RA Volume:   77.40 ml  35.55 ml/m LA Vol (A4C):   71.6 ml  32.89 ml/m LA Biplane Vol: 90.4 ml  41.53 ml/m  AORTIC VALVE AV Area (Vmax):    4.27 cm AV Area (Vmean):   4.53 cm AV Area (VTI):     4.38 cm AV Vmax:           196.50 cm/s AV Vmean:          125.000 cm/s AV VTI:            0.412 m AV Peak Grad:      15.4 mmHg AV Mean Grad:      7.5 mmHg LVOT  Vmax:         146.50 cm/s LVOT Vmean:        98.950 cm/s LVOT VTI:          0.316 m LVOT/AV VTI ratio: 0.77  AORTA Ao Root diam: 3.60 cm Ao Asc diam:  4.00 cm MITRAL VALVE MV Area (PHT): 4.06 cm             SHUNTS MV PHT:        54.13 msec           Systemic VTI:  0.32 m MV Decel Time: 187 msec             Systemic Diam: 2.70 cm MV E velocity: 85.87 cm/s 103 cm/s MV A velocity: 83.90 cm/s 70.3 cm/s MV E/A ratio:  1.02       1.5  Zoila Shutter MD Electronically signed by Zoila Shutter MD Signature Date/Time: 12/19/2019/12:00:27 PM    Final    CT HEAD CODE STROKE WO CONTRAST  Result Date: 12/19/2019 CLINICAL DATA:  Code stroke.  Confusion and aphasia EXAM: CT HEAD WITHOUT CONTRAST TECHNIQUE: Contiguous axial images were obtained from the base of the skull through the vertex without intravenous contrast. COMPARISON:  None. FINDINGS: Brain: There is no mass, hemorrhage or extra-axial collection. The size and configuration of the ventricles and extra-axial CSF spaces are normal. There is hypoattenuation of the periventricular white matter, most commonly indicating chronic ischemic microangiopathy. There is an area of acute or early subacute ischemia within the anterior left MCA distribution, involving the left frontal operculum. 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 Vascular: No abnormal hyperdensity of the major intracranial  arteries or dural venous sinuses. No intracranial atherosclerosis. Skull: The visualized skull base, calvarium and extracranial soft tissues are normal. Sinuses/Orbits: Moderate left maxillary sinus mucosal thickening. The orbits are normal. ASPECTS [facility] Stroke Program Early CT Score) - Ganglionic level infarction (caudate, lentiform nuclei, internal capsule, insula, M1-M3 cortex): 6 - Supraganglionic infarction (M4-M6 cortex): 2 Total score (0-10 with 10 being normal): 8 IMPRESSION: 1. 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 2. No hemorrhage. 3. 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 4. ASPECTS is 8. 5. These results were communicated to Dr. Caryl Pina at 12:10 am on 12/19/2019 by text page via the [facility] messaging system. Electronically Signed   By: Deatra Robinson M.D.   On: 12/19/2019 00:12        Scheduled Meds:  [START ON 12/20/2019] aspirin EC  81 mg Oral Daily   chlorhexidine  15 mL Mouth Rinse BID   enoxaparin (LOVENOX) injection  40 mg Subcutaneous Q24H   mouth rinse  15 mL Mouth Rinse BID   perflutren lipid microspheres (DEFINITY) IV suspension       Continuous Infusions:  sodium chloride 100 mL/hr at 12/19/19 1109     LOS: 0 days     Jacquelin Hawking, MD [facility] 12/19/2019, 12:14 PM  If 7PM-7AM, please contact night-coverage [email or web address]

## 2019-12-19 NOTE — H&P (Signed)
History and Physical    Raymond Vance ATF:[id number] DOB: [birth year] DOA: 12/18/2019  PCP: Wilburn Mylar, MD  Patient coming from: Home  I have personally briefly reviewed patient's old medical records in [facility]  Chief Complaint: Aphasia  HPI: Raymond Vance is a 78 y.o. male with medical history significant of HTN, CAD s/p CABG.  Patient presents to ED via EMS with acute onset of aphasia.  LKW 1500, had slight confusion start at that time.  This evening she noticed he was unable to speak and called EMS.   ED Course: Patient mute but able to MAE.  Came in as code stroke.  CT perfusion study confirms acute / subacute ischemic stroke.  See neurologist note for details.   Review of Systems: As per HPI, otherwise all review of systems negative.  Past Medical History:  Diagnosis Date   Hypertension    STEMI (ST elevation myocardial infarction) [facility])     Past Surgical History:  Procedure Laterality Date   CORONARY ARTERY BYPASS GRAFT       has no history on file for tobacco, alcohol, and drug.  No Known Allergies  History reviewed. No pertinent family history.   Prior to Admission medications   Not on File    Physical Exam: Vitals:   12/19/19 0100 12/19/19 0108 12/19/19 0115 12/19/19 0130  BP: 132/86  136/67 125/81  Pulse: (!) 29  61 67  Resp: 11  19 20   Temp:      SpO2: 95%  95% 95%  Weight:  99.2 kg    Height:  5\' 11"  (1.803 m)      Constitutional: NAD, calm, comfortable Eyes: PERRL, lids and conjunctivae normal ENMT: Mucous membranes are moist. Posterior pharynx clear of any exudate or lesions.Normal dentition.  Neck: normal, supple, no masses, no thyromegaly Respiratory: clear to auscultation bilaterally, no wheezing, no crackles. Normal respiratory effort. No accessory muscle use.  Cardiovascular: Regular rate and rhythm, no murmurs / rubs / gallops. No extremity edema. 2+ pedal pulses. No carotid bruits.  Abdomen: no tenderness, no masses  palpated. No hepatosplenomegaly. Bowel sounds positive.  Musculoskeletal: no clubbing / cyanosis. No joint deformity upper and lower extremities. Good ROM, no contractures. Normal muscle tone.  Skin: no rashes, lesions, ulcers. No induration Neurologic: Dense aphasia, non-verbal.  MAE with 5/5 strength. Psychiatric: Normal judgment and insight. Alert and oriented x 3. Normal mood.    Labs on Admission: I have personally reviewed following labs and imaging studies  CBC: Recent Labs  Lab 12/18/19 2351 12/18/19 2356  WBC 7.5  --   NEUTROABS 4.2  --   HGB 16.9 17.3*  HCT 51.7 51.0  MCV 93.2  --   PLT 169  --    Basic Metabolic Panel: Recent Labs  Lab 12/18/19 2351 12/18/19 2356  NA 136 136  K 4.5 4.4  CL 103 104  CO2 21*  --   GLUCOSE 103* 99  BUN 28* 31*  CREATININE 1.24 1.10  CALCIUM 9.1  --    GFR: Estimated Creatinine Clearance: 65.4 mL/min (by C-G formula based on SCr of 1.1 mg/dL). Liver Function Tests: Recent Labs  Lab 12/18/19 2351  AST 16  ALT 19  ALKPHOS 50  BILITOT 0.7  PROT 6.5  ALBUMIN 3.8   No results for input(s): LIPASE, AMYLASE in the last 168 hours. No results for input(s): AMMONIA in the last 168 hours. Coagulation Profile: Recent Labs  Lab 12/18/19 2351  INR 1.0   Cardiac Enzymes:  No results for input(s): CKTOTAL, CKMB, CKMBINDEX, TROPONINI in the last 168 hours. BNP (last 3 results) No results for input(s): PROBNP in the last 8760 hours. HbA1C: No results for input(s): HGBA1C in the last 72 hours. CBG: Recent Labs  Lab 12/18/19 2350  GLUCAP 100*   Lipid Profile: No results for input(s): CHOL, HDL, LDLCALC, TRIG, CHOLHDL, LDLDIRECT in the last 72 hours. Thyroid Function Tests: No results for input(s): TSH, T4TOTAL, FREET4, T3FREE, THYROIDAB in the last 72 hours. Anemia Panel: No results for input(s): VITAMINB12, FOLATE, FERRITIN, TIBC, IRON, RETICCTPCT in the last 72 hours. Urine analysis: No results found for: COLORURINE,  APPEARANCEUR, LABSPEC, PHURINE, GLUCOSEU, HGBUR, BILIRUBINUR, KETONESUR, PROTEINUR, UROBILINOGEN, NITRITE, LEUKOCYTESUR  Radiological Exams on Admission: CT Code Stroke CTA Head W/WO contrast  Result Date: 12/19/2019 CLINICAL DATA:  Aphasia EXAM: CT ANGIOGRAPHY HEAD AND NECK CT PERFUSION BRAIN TECHNIQUE: Multidetector CT imaging of the head and neck was performed using the standard protocol during bolus administration of intravenous contrast. Multiplanar CT image reconstructions and MIPs were obtained to evaluate the vascular anatomy. Carotid stenosis measurements (when applicable) are obtained utilizing NASCET criteria, using the distal internal carotid diameter as the denominator. Multiphase CT imaging of the brain was performed following IV bolus contrast injection. Subsequent parametric perfusion maps were calculated using RAPID software. CONTRAST:  OMNIPAQUE IOHEXOL 350 MG/ML SOLN COMPARISON:  None. FINDINGS: CTA NECK FINDINGS SKELETON: There is no bony spinal canal stenosis. No lytic or blastic lesion. OTHER NECK: Normal pharynx, larynx and major salivary glands. No cervical lymphadenopathy. Unremarkable thyroid gland. UPPER CHEST: No pneumothorax or pleural effusion. No nodules or masses. AORTIC ARCH: There is no calcific atherosclerosis of the aortic arch. There is no aneurysm, dissection or hemodynamically significant stenosis of the visualized portion of the aorta. Conventional 3 vessel aortic branching pattern. The visualized proximal subclavian arteries are widely patent. RIGHT CAROTID SYSTEM: No dissection, occlusion or aneurysm. Mild atherosclerotic calcification at the carotid bifurcation without hemodynamically significant stenosis. LEFT CAROTID SYSTEM: No dissection, occlusion or aneurysm. Mild atherosclerotic calcification at the carotid bifurcation without hemodynamically significant stenosis. VERTEBRAL ARTERIES: Left dominant configuration. Moderate calcification of the left vertebral  artery origin. There is no dissection, occlusion or flow-limiting stenosis to the skull base (V1-V3 segments). CTA HEAD FINDINGS POSTERIOR CIRCULATION: --Vertebral arteries: Bilateral atherosclerotic calcification of the V4 segments with severe stenosis on the right. No stenosis on the left. --Posterior inferior cerebellar arteries (PICA): Patent origins from the vertebral arteries. --Anterior inferior cerebellar arteries (AICA): Patent origins from the basilar artery. --Basilar artery: Normal. --Superior cerebellar arteries: Normal. --Posterior cerebral arteries: Normal. Both originate from the basilar artery. Posterior communicating arteries (p-comm) are diminutive or absent. ANTERIOR CIRCULATION: --Intracranial internal carotid arteries: Atherosclerotic calcification of the internal carotid arteries at the skull base without hemodynamically significant stenosis. --Anterior cerebral arteries (ACA): Normal. Both A1 segments are present. Patent anterior communicating artery (a-comm). --Middle cerebral arteries (MCA): There is moderate stenosis of the distal right MCA M1 segment. There is no proximal occlusion. There is mild narrowing of the distal left M1 segment. The M2 branches are patent. There is tapering of an M3 branch in the area of the frontal operculum infarct. VENOUS SINUSES: As permitted by contrast timing, patent. ANATOMIC VARIANTS: None Review of the MIP images confirms the above findings. CT Brain Perfusion Findings: ASPECTS: 8 CBF (<30%) Volume: 11mL Perfusion (Tmax>6.0s) volume: 36mL Mismatch Volume: 25mL Infarction Location:Left frontal operculum IMPRESSION: 1. No emergent large vessel occlusion. 2. 11 mL core infarct in the left frontal  operculum with surrounding 25 mL ischemic penumbra. 3. Mild narrowing of the distal left M1 segment with tapering of an M3 branch in the area of the frontal operculum infarct. 4. 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 5. Mild bilateral  carotid bifurcation atherosclerosis without hemodynamically significant stenosis. 6. Moderate stenosis of the distal right MCA M1 segment. These results were called by telephone at the time of interpretation on 12/19/2019 at 12:30 am to provider ERIC [facility] , who verbally acknowledged these results. Electronically Signed   By: Deatra Robinson M.D.   On: 12/19/2019 00:35   CT Code Stroke CTA Neck W/WO contrast  Result Date: 12/19/2019 CLINICAL DATA:  Aphasia EXAM: CT ANGIOGRAPHY HEAD AND NECK CT PERFUSION BRAIN TECHNIQUE: Multidetector CT imaging of the head and neck was performed using the standard protocol during bolus administration of intravenous contrast. Multiplanar CT image reconstructions and MIPs were obtained to evaluate the vascular anatomy. Carotid stenosis measurements (when applicable) are obtained utilizing NASCET criteria, using the distal internal carotid diameter as the denominator. Multiphase CT imaging of the brain was performed following IV bolus contrast injection. Subsequent parametric perfusion maps were calculated using RAPID software. CONTRAST:  OMNIPAQUE IOHEXOL 350 MG/ML SOLN COMPARISON:  None. FINDINGS: CTA NECK FINDINGS SKELETON: There is no bony spinal canal stenosis. No lytic or blastic lesion. OTHER NECK: Normal pharynx, larynx and major salivary glands. No cervical lymphadenopathy. Unremarkable thyroid gland. UPPER CHEST: No pneumothorax or pleural effusion. No nodules or masses. AORTIC ARCH: There is no calcific atherosclerosis of the aortic arch. There is no aneurysm, dissection or hemodynamically significant stenosis of the visualized portion of the aorta. Conventional 3 vessel aortic branching pattern. The visualized proximal subclavian arteries are widely patent. RIGHT CAROTID SYSTEM: No dissection, occlusion or aneurysm. Mild atherosclerotic calcification at the carotid bifurcation without hemodynamically significant stenosis. LEFT CAROTID SYSTEM: No dissection,  occlusion or aneurysm. Mild atherosclerotic calcification at the carotid bifurcation without hemodynamically significant stenosis. VERTEBRAL ARTERIES: Left dominant configuration. Moderate calcification of the left vertebral artery origin. There is no dissection, occlusion or flow-limiting stenosis to the skull base (V1-V3 segments). CTA HEAD FINDINGS POSTERIOR CIRCULATION: --Vertebral arteries: Bilateral atherosclerotic calcification of the V4 segments with severe stenosis on the right. No stenosis on the left. --Posterior inferior cerebellar arteries (PICA): Patent origins from the vertebral arteries. --Anterior inferior cerebellar arteries (AICA): Patent origins from the basilar artery. --Basilar artery: Normal. --Superior cerebellar arteries: Normal. --Posterior cerebral arteries: Normal. Both originate from the basilar artery. Posterior communicating arteries (p-comm) are diminutive or absent. ANTERIOR CIRCULATION: --Intracranial internal carotid arteries: Atherosclerotic calcification of the internal carotid arteries at the skull base without hemodynamically significant stenosis. --Anterior cerebral arteries (ACA): Normal. Both A1 segments are present. Patent anterior communicating artery (a-comm). --Middle cerebral arteries (MCA): There is moderate stenosis of the distal right MCA M1 segment. There is no proximal occlusion. There is mild narrowing of the distal left M1 segment. The M2 branches are patent. There is tapering of an M3 branch in the area of the frontal operculum infarct. VENOUS SINUSES: As permitted by contrast timing, patent. ANATOMIC VARIANTS: None Review of the MIP images confirms the above findings. CT Brain Perfusion Findings: ASPECTS: 8 CBF (<30%) Volume: 11mL Perfusion (Tmax>6.0s) volume: 36mL Mismatch Volume: 25mL Infarction Location:Left frontal operculum IMPRESSION: 1. No emergent large vessel occlusion. 2. 11 mL core infarct in the left frontal operculum with surrounding 25 mL ischemic  penumbra. 3. Mild narrowing of the distal left M1 segment with tapering of  an M3 branch in the area of the frontal operculum infarct. 4. 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 5. Mild bilateral carotid bifurcation atherosclerosis without hemodynamically significant stenosis. 6. Moderate stenosis of the distal right MCA M1 segment. These results were called by telephone at the time of interpretation on 12/19/2019 at 12:30 am to provider ERIC [facility] , who verbally acknowledged these results. Electronically Signed   By: Deatra Robinson M.D.   On: 12/19/2019 00:35   CT Code Stroke Cerebral Perfusion with contrast  Result Date: 12/19/2019 CLINICAL DATA:  Aphasia EXAM: CT ANGIOGRAPHY HEAD AND NECK CT PERFUSION BRAIN TECHNIQUE: Multidetector CT imaging of the head and neck was performed using the standard protocol during bolus administration of intravenous contrast. Multiplanar CT image reconstructions and MIPs were obtained to evaluate the vascular anatomy. Carotid stenosis measurements (when applicable) are obtained utilizing NASCET criteria, using the distal internal carotid diameter as the denominator. Multiphase CT imaging of the brain was performed following IV bolus contrast injection. Subsequent parametric perfusion maps were calculated using RAPID software. CONTRAST:  OMNIPAQUE IOHEXOL 350 MG/ML SOLN COMPARISON:  None. FINDINGS: CTA NECK FINDINGS SKELETON: There is no bony spinal canal stenosis. No lytic or blastic lesion. OTHER NECK: Normal pharynx, larynx and major salivary glands. No cervical lymphadenopathy. Unremarkable thyroid gland. UPPER CHEST: No pneumothorax or pleural effusion. No nodules or masses. AORTIC ARCH: There is no calcific atherosclerosis of the aortic arch. There is no aneurysm, dissection or hemodynamically significant stenosis of the visualized portion of the aorta. Conventional 3 vessel aortic branching pattern. The visualized proximal subclavian  arteries are widely patent. RIGHT CAROTID SYSTEM: No dissection, occlusion or aneurysm. Mild atherosclerotic calcification at the carotid bifurcation without hemodynamically significant stenosis. LEFT CAROTID SYSTEM: No dissection, occlusion or aneurysm. Mild atherosclerotic calcification at the carotid bifurcation without hemodynamically significant stenosis. VERTEBRAL ARTERIES: Left dominant configuration. Moderate calcification of the left vertebral artery origin. There is no dissection, occlusion or flow-limiting stenosis to the skull base (V1-V3 segments). CTA HEAD FINDINGS POSTERIOR CIRCULATION: --Vertebral arteries: Bilateral atherosclerotic calcification of the V4 segments with severe stenosis on the right. No stenosis on the left. --Posterior inferior cerebellar arteries (PICA): Patent origins from the vertebral arteries. --Anterior inferior cerebellar arteries (AICA): Patent origins from the basilar artery. --Basilar artery: Normal. --Superior cerebellar arteries: Normal. --Posterior cerebral arteries: Normal. Both originate from the basilar artery. Posterior communicating arteries (p-comm) are diminutive or absent. ANTERIOR CIRCULATION: --Intracranial internal carotid arteries: Atherosclerotic calcification of the internal carotid arteries at the skull base without hemodynamically significant stenosis. --Anterior cerebral arteries (ACA): Normal. Both A1 segments are present. Patent anterior communicating artery (a-comm). --Middle cerebral arteries (MCA): There is moderate stenosis of the distal right MCA M1 segment. There is no proximal occlusion. There is mild narrowing of the distal left M1 segment. The M2 branches are patent. There is tapering of an M3 branch in the area of the frontal operculum infarct. VENOUS SINUSES: As permitted by contrast timing, patent. ANATOMIC VARIANTS: None Review of the MIP images confirms the above findings. CT Brain Perfusion Findings: ASPECTS: 8 CBF (<30%) Volume: 11mL  Perfusion (Tmax>6.0s) volume: 36mL Mismatch Volume: 25mL Infarction Location:Left frontal operculum IMPRESSION: 1. No emergent large vessel occlusion. 2. 11 mL core infarct in the left frontal operculum with surrounding 25 mL ischemic penumbra. 3. Mild narrowing of the distal left M1 segment with tapering of an M3 branch in the area of the frontal operculum infarct. 4. Severe stenosis of the right V4 segment of  the vertebral artery. 5. Mild bilateral carotid bifurcation atherosclerosis without hemodynamically significant stenosis. 6. Moderate stenosis of the distal right MCA M1 segment. These results were called by telephone at the time of interpretation on 12/19/2019 at 12:30 am to provider ERIC [facility] , who verbally acknowledged these results. Electronically Signed   By: Ulyses Jarred M.D.   On: 12/19/2019 00:35   CT HEAD CODE STROKE WO CONTRAST  Result Date: 12/19/2019 CLINICAL DATA:  Code stroke.  Confusion and aphasia EXAM: CT HEAD WITHOUT CONTRAST TECHNIQUE: Contiguous axial images were obtained from the base of the skull through the vertex without intravenous contrast. COMPARISON:  None. FINDINGS: Brain: There is no mass, hemorrhage or extra-axial collection. The size and configuration of the ventricles and extra-axial CSF spaces are normal. There is hypoattenuation of the periventricular white matter, most commonly indicating chronic ischemic microangiopathy. There is an area of acute or early subacute ischemia within the anterior left MCA distribution, involving the left frontal operculum. 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 Vascular: No abnormal hyperdensity of the major intracranial arteries or dural venous sinuses. No intracranial atherosclerosis. Skull: The visualized skull base, calvarium and extracranial soft tissues are normal. Sinuses/Orbits: Moderate left maxillary sinus mucosal thickening. The orbits are normal. ASPECTS [facility] Stroke Program Early CT Score) - Ganglionic  level infarction (caudate, lentiform nuclei, internal capsule, insula, M1-M3 cortex): 6 - Supraganglionic infarction (M4-M6 cortex): 2 Total score (0-10 with 10 being normal): 8 IMPRESSION: 1. 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 2. No hemorrhage. 3. 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 4. ASPECTS is 8. 5. These results were communicated to Dr. Kerney Elbe at 12:10 am on 12/19/2019 by text page via the [facility] messaging system. Electronically Signed   By: Ulyses Jarred M.D.   On: 12/19/2019 00:12    EKG: Independently reviewed.  Assessment/Plan Principal Problem:   Acute ischemic stroke [facility]) Active Problems:   HTN (hypertension)   CAD (coronary artery disease)   Severe aphasia    1. Acute ischemic stroke with severe aphasia - 1. Stroke pathway 2. MRI brain 3. NPO pending swallow screen / SLP eval (im assuming hes already considered to have failed his bedside swallow given the severity of the aphasia) 1. NS at 100 cc/hr to prevent dehydration 4. ASA 600 PR given in ED + 81 PO daily 5. 2d echo 6. Tele monitor 7. PT/OT/SLP 2. HTN - 1. Hold home BP meds and allow permissive HTN 3. H/o CAD - 1. ASA as above 2. Continue / start statin once med rec completed and he is able to take POs  DVT prophylaxis: Lovenox Code Status: Full Family Communication: No family in room Disposition Plan: TBD Consults called: Neuro Admission status: Admit to inpatient  Severity of Illness: The appropriate patient status for this patient is INPATIENT. Inpatient status is judged to be reasonable and necessary in order to provide the required intensity of service to ensure the patient's safety. The patient's presenting symptoms, physical exam findings, and initial radiographic and laboratory data in the context of their chronic comorbidities is felt to place them at high risk for further clinical  deterioration. Furthermore, it is not anticipated that the patient will be medically stable for discharge from the hospital within 2 midnights of admission. The following factors support the patient status of inpatient.   IP status for acute ischemic stroke on imaging studies with severe aphasia.  * I certify that  at the point of admission it is my clinical judgment that the patient will require inpatient hospital care spanning beyond 2 midnights from the point of admission due to high intensity of service, high risk for further deterioration and high frequency of surveillance required.*    Rober Skeels M. DO [facility]  How to contact the [facility] Attending or Consulting provider 7A - 7P or covering provider during after hours 7P -7A, for this patient?  1. Check the care team in [facility] and look for a) attending/consulting [facility] provider listed and b) the [facility] team listed 2. Log into [email or web address]  [employer] Physician Scheduling and messaging for groups and whole hospitals  On call and physician scheduling software for group practices, residents, hospitalists and other medical providers for call, clinic, rotation and shift schedules. On[employer] is a hospital-wide system for scheduling doctors and paging doctors on call. EasyPlot is for scientific plotting and data analysis.  [email or web address]  and use [facility]'s universal password to access. If you do not have the password, please contact the hospital operator.  3. Locate the [facility] provider you are looking for under [facility] and page to a number that you can be directly reached. 4. If you still have difficulty reaching the provider, please page the [facility] (Director on Call) for the Hospitalists listed on amion for assistance.  12/19/2019, 2:19 AM

## 2019-12-19 NOTE — ED Provider Notes (Signed)
[facility] EMERGENCY DEPARTMENT Provider Note   CSN: [id number] Arrival date & time: 12/18/19  3154  An emergency department physician performed an initial assessment on this suspected stroke patient at 2347.  History Chief Complaint  Patient presents with   Code Stroke    LEVEL 5 CAVEAT 2/2 AMS AND ACUITY OF CONDITION  Raymond Vance is a 78 y.o. male.   78 y/o male presents to the ED as a CODE STROKE. LSN 1500.  Per Neurology note, patient "started to exhibit slight confusion, per wife. This evening, she noted that he was unable to speak and called EMS. On EMS arrival he was mute but could move all 4 extremities equally, had no facial droop and was able to ambulate to the stretcher. On arrival to the ED he continued to be mute; receptive aphasia was also noted"  The history is provided by medical records and the EMS personnel. No language interpreter was used.       Past Medical History:  Diagnosis Date   Hypertension    STEMI (ST elevation myocardial infarction) (HCC)     There are no problems to display for this patient.   ** The histories are not reviewed yet. Please review them in the "History" navigator section and refresh this SmartLink.     History reviewed. No pertinent family history.  Social History   Tobacco Use   Smoking status: Not on file  Substance Use Topics   Alcohol use: Not on file   Drug use: Not on file    Home Medications Prior to Admission medications   Not on File    Allergies    Patient has no known allergies.  Review of Systems   Review of Systems  Unable to perform ROS: Acuity of condition    Physical Exam Updated Vital Signs BP 125/81    Pulse 67    Temp 98.1 F (36.7 C)    Resp 20    Ht 5\' 11"  (1.803 m)    Wt 99.2 kg    SpO2 95%    BMI 30.50 kg/m   Physical Exam Vitals and nursing note reviewed.  Constitutional:      General: He is not in acute distress.    Appearance: He is well-developed.  He is not diaphoretic.     Comments: Nontoxic appearing  HENT:     Head: Normocephalic and atraumatic.  Eyes:     General: No scleral icterus.    Extraocular Movements: Extraocular movements intact.     Conjunctiva/sclera: Conjunctivae normal.     Pupils: Pupils are equal, round, and reactive to light.  Pulmonary:     Effort: Pulmonary effort is normal. No respiratory distress.     Comments: Congested cough. Lungs grossly clear. Musculoskeletal:        General: Normal range of motion.     Cervical back: Normal range of motion.  Skin:    General: Skin is warm and dry.     Coloration: Skin is not pale.     Findings: No erythema or Raz.  Neurological:     Mental Status: He is alert.     Comments: Expressive aphasia. Alert. Will follow commands. No facial asymmetry noted. Equal grip strength bilaterally with normal strength in all 4 extremities. No pronator drift. Ambulation not assessed.   Psychiatric:        Speech: He is noncommunicative.     ED Results / Procedures / Treatments   Labs (all labs ordered are  listed, but only abnormal results are displayed) Labs Reviewed  COMPREHENSIVE METABOLIC PANEL - Abnormal; Notable for the following components:      Result Value   CO2 21 (*)    Glucose, Bld 103 (*)    BUN 28 (*)    GFR calc non Af Amer 55 (*)    All other components within normal limits  I-STAT CHEM 8, ED - Abnormal; Notable for the following components:   BUN 31 (*)    Calcium, Ion 1.07 (*)    Hemoglobin 17.3 (*)    All other components within normal limits  CBG MONITORING, ED - Abnormal; Notable for the following components:   Glucose-Capillary 100 (*)    All other components within normal limits  RESPIRATORY PANEL BY RT PCR (FLU A&B, COVID)  PROTIME-INR  APTT  CBC  DIFFERENTIAL    EKG EKG Interpretation  Date/Time:  Monday December 19 2019 00:41:21 EST Ventricular Rate:  55 PR Interval:    QRS Duration: 184 QT Interval:  508 QTC Calculation: 486 R  Axis:   -47 Text Interpretation: Sinus rhythm Ventricular trigeminy Borderline prolonged PR interval Probable left atrial enlargement Left bundle branch block No old tracing to compare Confirmed by Dione Booze ([id number]) on 12/19/2019 12:45:50 AM   Radiology CT Code Stroke CTA Head W/WO contrast  Result Date: 12/19/2019 CLINICAL DATA:  Aphasia EXAM: CT ANGIOGRAPHY HEAD AND NECK CT PERFUSION BRAIN TECHNIQUE: Multidetector CT imaging of the head and neck was performed using the standard protocol during bolus administration of intravenous contrast. Multiplanar CT image reconstructions and MIPs were obtained to evaluate the vascular anatomy. Carotid stenosis measurements (when applicable) are obtained utilizing NASCET criteria, using the distal internal carotid diameter as the denominator. Multiphase CT imaging of the brain was performed following IV bolus contrast injection. Subsequent parametric perfusion maps were calculated using RAPID software. CONTRAST:  OMNIPAQUE IOHEXOL 350 MG/ML SOLN COMPARISON:  None. FINDINGS: CTA NECK FINDINGS SKELETON: There is no bony spinal canal stenosis. No lytic or blastic lesion. OTHER NECK: Normal pharynx, larynx and major salivary glands. No cervical lymphadenopathy. Unremarkable thyroid gland. UPPER CHEST: No pneumothorax or pleural effusion. No nodules or masses. AORTIC ARCH: There is no calcific atherosclerosis of the aortic arch. There is no aneurysm, dissection or hemodynamically significant stenosis of the visualized portion of the aorta. Conventional 3 vessel aortic branching pattern. The visualized proximal subclavian arteries are widely patent. RIGHT CAROTID SYSTEM: No dissection, occlusion or aneurysm. Mild atherosclerotic calcification at the carotid bifurcation without hemodynamically significant stenosis. LEFT CAROTID SYSTEM: No dissection, occlusion or aneurysm. Mild atherosclerotic calcification at the carotid bifurcation without hemodynamically  significant stenosis. VERTEBRAL ARTERIES: Left dominant configuration. Moderate calcification of the left vertebral artery origin. There is no dissection, occlusion or flow-limiting stenosis to the skull base (V1-V3 segments). CTA HEAD FINDINGS POSTERIOR CIRCULATION: --Vertebral arteries: Bilateral atherosclerotic calcification of the V4 segments with severe stenosis on the right. No stenosis on the left. --Posterior inferior cerebellar arteries (PICA): Patent origins from the vertebral arteries. --Anterior inferior cerebellar arteries (AICA): Patent origins from the basilar artery. --Basilar artery: Normal. --Superior cerebellar arteries: Normal. --Posterior cerebral arteries: Normal. Both originate from the basilar artery. Posterior communicating arteries (p-comm) are diminutive or absent. ANTERIOR CIRCULATION: --Intracranial internal carotid arteries: Atherosclerotic calcification of the internal carotid arteries at the skull base without hemodynamically significant stenosis. --Anterior cerebral arteries (ACA): Normal. Both A1 segments are present. Patent anterior communicating artery (a-comm). --Middle cerebral arteries (MCA): There is moderate stenosis of the distal right  MCA M1 segment. There is no proximal occlusion. There is mild narrowing of the distal left M1 segment. The M2 branches are patent. There is tapering of an M3 branch in the area of the frontal operculum infarct. VENOUS SINUSES: As permitted by contrast timing, patent. ANATOMIC VARIANTS: None Review of the MIP images confirms the above findings. CT Brain Perfusion Findings: ASPECTS: 8 CBF (<30%) Volume: 82mL Perfusion (Tmax>6.0s) volume: 65mL Mismatch Volume: 37mL Infarction Location:Left frontal operculum IMPRESSION: 1. No emergent large vessel occlusion. 2. 11 mL core infarct in the left frontal operculum with surrounding 25 mL ischemic penumbra. 3. Mild narrowing of the distal left M1 segment with tapering of an M3 branch in the area of the  frontal operculum infarct. 4. 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 5. Mild bilateral carotid bifurcation atherosclerosis without hemodynamically significant stenosis. 6. Moderate stenosis of the distal right MCA M1 segment. These results were called by telephone at the time of interpretation on 12/19/2019 at 12:30 am to provider ERIC [facility] , who verbally acknowledged these results. Electronically Signed   By: Ulyses Jarred M.D.   On: 12/19/2019 00:35   CT Code Stroke CTA Neck W/WO contrast  Result Date: 12/19/2019 CLINICAL DATA:  Aphasia EXAM: CT ANGIOGRAPHY HEAD AND NECK CT PERFUSION BRAIN TECHNIQUE: Multidetector CT imaging of the head and neck was performed using the standard protocol during bolus administration of intravenous contrast. Multiplanar CT image reconstructions and MIPs were obtained to evaluate the vascular anatomy. Carotid stenosis measurements (when applicable) are obtained utilizing NASCET criteria, using the distal internal carotid diameter as the denominator. Multiphase CT imaging of the brain was performed following IV bolus contrast injection. Subsequent parametric perfusion maps were calculated using RAPID software. CONTRAST:  158mL OMNIPAQUE IOHEXOL 350 MG/ML SOLN COMPARISON:  None. FINDINGS: CTA NECK FINDINGS SKELETON: There is no bony spinal canal stenosis. No lytic or blastic lesion. OTHER NECK: Normal pharynx, larynx and major salivary glands. No cervical lymphadenopathy. Unremarkable thyroid gland. UPPER CHEST: No pneumothorax or pleural effusion. No nodules or masses. AORTIC ARCH: There is no calcific atherosclerosis of the aortic arch. There is no aneurysm, dissection or hemodynamically significant stenosis of the visualized portion of the aorta. Conventional 3 vessel aortic branching pattern. The visualized proximal subclavian arteries are widely patent. RIGHT CAROTID SYSTEM: No dissection, occlusion or aneurysm. Mild atherosclerotic calcification  at the carotid bifurcation without hemodynamically significant stenosis. LEFT CAROTID SYSTEM: No dissection, occlusion or aneurysm. Mild atherosclerotic calcification at the carotid bifurcation without hemodynamically significant stenosis. VERTEBRAL ARTERIES: Left dominant configuration. Moderate calcification of the left vertebral artery origin. There is no dissection, occlusion or flow-limiting stenosis to the skull base (V1-V3 segments). CTA HEAD FINDINGS POSTERIOR CIRCULATION: --Vertebral arteries: Bilateral atherosclerotic calcification of the V4 segments with severe stenosis on the right. No stenosis on the left. --Posterior inferior cerebellar arteries (PICA): Patent origins from the vertebral arteries. --Anterior inferior cerebellar arteries (AICA): Patent origins from the basilar artery. --Basilar artery: Normal. --Superior cerebellar arteries: Normal. --Posterior cerebral arteries: Normal. Both originate from the basilar artery. Posterior communicating arteries (p-comm) are diminutive or absent. ANTERIOR CIRCULATION: --Intracranial internal carotid arteries: Atherosclerotic calcification of the internal carotid arteries at the skull base without hemodynamically significant stenosis. --Anterior cerebral arteries (ACA): Normal. Both A1 segments are present. Patent anterior communicating artery (a-comm). --Middle cerebral arteries (MCA): There is moderate stenosis of the distal right MCA M1 segment. There is no proximal occlusion. There is mild narrowing of the distal left M1 segment. The M2  branches are patent. There is tapering of an M3 branch in the area of the frontal operculum infarct. VENOUS SINUSES: As permitted by contrast timing, patent. ANATOMIC VARIANTS: None Review of the MIP images confirms the above findings. CT Brain Perfusion Findings: ASPECTS: 8 CBF (<30%) Volume: 11mL Perfusion (Tmax>6.0s) volume: 36mL Mismatch Volume: 25mL Infarction Location:Left frontal operculum IMPRESSION: 1. No  emergent large vessel occlusion. 2. 11 mL core infarct in the left frontal operculum with surrounding 25 mL ischemic penumbra. 3. Mild narrowing of the distal left M1 segment with tapering of an M3 branch in the area of the frontal operculum infarct. 4. 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 5. Mild bilateral carotid bifurcation atherosclerosis without hemodynamically significant stenosis. 6. Moderate stenosis of the distal right MCA M1 segment. These results were called by telephone at the time of interpretation on 12/19/2019 at 12:30 am to provider ERIC [facility] , who verbally acknowledged these results. Electronically Signed   By: Deatra Robinson M.D.   On: 12/19/2019 00:35   CT Code Stroke Cerebral Perfusion with contrast  Result Date: 12/19/2019 CLINICAL DATA:  Aphasia EXAM: CT ANGIOGRAPHY HEAD AND NECK CT PERFUSION BRAIN TECHNIQUE: Multidetector CT imaging of the head and neck was performed using the standard protocol during bolus administration of intravenous contrast. Multiplanar CT image reconstructions and MIPs were obtained to evaluate the vascular anatomy. Carotid stenosis measurements (when applicable) are obtained utilizing NASCET criteria, using the distal internal carotid diameter as the denominator. Multiphase CT imaging of the brain was performed following IV bolus contrast injection. Subsequent parametric perfusion maps were calculated using RAPID software. CONTRAST:  OMNIPAQUE IOHEXOL 350 MG/ML SOLN COMPARISON:  None. FINDINGS: CTA NECK FINDINGS SKELETON: There is no bony spinal canal stenosis. No lytic or blastic lesion. OTHER NECK: Normal pharynx, larynx and major salivary glands. No cervical lymphadenopathy. Unremarkable thyroid gland. UPPER CHEST: No pneumothorax or pleural effusion. No nodules or masses. AORTIC ARCH: There is no calcific atherosclerosis of the aortic arch. There is no aneurysm, dissection or hemodynamically significant stenosis of the  visualized portion of the aorta. Conventional 3 vessel aortic branching pattern. The visualized proximal subclavian arteries are widely patent. RIGHT CAROTID SYSTEM: No dissection, occlusion or aneurysm. Mild atherosclerotic calcification at the carotid bifurcation without hemodynamically significant stenosis. LEFT CAROTID SYSTEM: No dissection, occlusion or aneurysm. Mild atherosclerotic calcification at the carotid bifurcation without hemodynamically significant stenosis. VERTEBRAL ARTERIES: Left dominant configuration. Moderate calcification of the left vertebral artery origin. There is no dissection, occlusion or flow-limiting stenosis to the skull base (V1-V3 segments). CTA HEAD FINDINGS POSTERIOR CIRCULATION: --Vertebral arteries: Bilateral atherosclerotic calcification of the V4 segments with severe stenosis on the right. No stenosis on the left. --Posterior inferior cerebellar arteries (PICA): Patent origins from the vertebral arteries. --Anterior inferior cerebellar arteries (AICA): Patent origins from the basilar artery. --Basilar artery: Normal. --Superior cerebellar arteries: Normal. --Posterior cerebral arteries: Normal. Both originate from the basilar artery. Posterior communicating arteries (p-comm) are diminutive or absent. ANTERIOR CIRCULATION: --Intracranial internal carotid arteries: Atherosclerotic calcification of the internal carotid arteries at the skull base without hemodynamically significant stenosis. --Anterior cerebral arteries (ACA): Normal. Both A1 segments are present. Patent anterior communicating artery (a-comm). --Middle cerebral arteries (MCA): There is moderate stenosis of the distal right MCA M1 segment. There is no proximal occlusion. There is mild narrowing of the distal left M1 segment. The M2 branches are patent. There is tapering of an M3 branch in the area of the frontal operculum infarct. VENOUS SINUSES:  As permitted by contrast timing, patent. ANATOMIC VARIANTS: None  Review of the MIP images confirms the above findings. CT Brain Perfusion Findings: ASPECTS: 8 CBF (<30%) Volume: 11mL Perfusion (Tmax>6.0s) volume: 36mL Mismatch Volume: 25mL Infarction Location:Left frontal operculum IMPRESSION: 1. No emergent large vessel occlusion. 2. 11 mL core infarct in the left frontal operculum with surrounding 25 mL ischemic penumbra. 3. Mild narrowing of the distal left M1 segment with tapering of an M3 branch in the area of the frontal operculum infarct. 4. 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 5. Mild bilateral carotid bifurcation atherosclerosis without hemodynamically significant stenosis. 6. Moderate stenosis of the distal right MCA M1 segment. These results were called by telephone at the time of interpretation on 12/19/2019 at 12:30 am to provider ERIC [facility] , who verbally acknowledged these results. Electronically Signed   By: Deatra Robinson M.D.   On: 12/19/2019 00:35   CT HEAD CODE STROKE WO CONTRAST  Result Date: 12/19/2019 CLINICAL DATA:  Code stroke.  Confusion and aphasia EXAM: CT HEAD WITHOUT CONTRAST TECHNIQUE: Contiguous axial images were obtained from the base of the skull through the vertex without intravenous contrast. COMPARISON:  None. FINDINGS: Brain: There is no mass, hemorrhage or extra-axial collection. The size and configuration of the ventricles and extra-axial CSF spaces are normal. There is hypoattenuation of the periventricular white matter, most commonly indicating chronic ischemic microangiopathy. There is an area of acute or early subacute ischemia within the anterior left MCA distribution, involving the left frontal operculum. 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 Vascular: No abnormal hyperdensity of the major intracranial arteries or dural venous sinuses. No intracranial atherosclerosis. Skull: The visualized skull base, calvarium and extracranial soft tissues are normal. Sinuses/Orbits: Moderate left  maxillary sinus mucosal thickening. The orbits are normal. ASPECTS [facility] Stroke Program Early CT Score) - Ganglionic level infarction (caudate, lentiform nuclei, internal capsule, insula, M1-M3 cortex): 6 - Supraganglionic infarction (M4-M6 cortex): 2 Total score (0-10 with 10 being normal): 8 IMPRESSION: 1. 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 2. No hemorrhage. 3. 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 4. ASPECTS is 8. 5. These results were communicated to Dr. Caryl Pina at 12:10 am on 12/19/2019 by text page via the [facility] messaging system. Electronically Signed   By: Deatra Robinson M.D.   On: 12/19/2019 00:12    Procedures .Critical Care Performed by: Antony Madura, PA-C Authorized by: Antony Madura, PA-C   Critical care provider statement:    Critical care time (minutes):  45   Critical care was necessary to treat or prevent imminent or life-threatening deterioration of the following conditions: acute CVA.   Critical care was time spent personally by me on the following activities:  Discussions with consultants, evaluation of patient's response to treatment, examination of patient, ordering and performing treatments and interventions, ordering and review of laboratory studies, ordering and review of radiographic studies, pulse oximetry, re-evaluation of patient's condition, obtaining history from patient or surrogate and review of old charts   (including critical care time)  Medications Ordered in ED Medications  sodium chloride flush (NS) 0.9 % injection 3 mL (3 mLs Intravenous Given 12/19/19 0050)  iohexol (OMNIPAQUE) 350 MG/ML injection 100 mL (100 mLs Intravenous Contrast Given 12/19/19 0017)  aspirin suppository 600 mg (600 mg Rectal Given 12/19/19 0051)  0.9 %  sodium chloride infusion ( Intravenous New Bag/Given 12/19/19 0050)    ED Course  I have reviewed the triage vital signs  and the nursing notes.  Pertinent labs & imaging results that were available during my care of the patient were reviewed by me and considered in my medical decision making (see chart for details).   12:10 AM CT scan reviewed by myself while patient in scanner. Changes c/w acute embolic left CVA. Pending CTA and formal neurology recommendations.  12:39 AM Per Dr. Otelia LimesLindzen, no LVO on CTA. Plan for IVF, ASA suppository, MRI brain. Will admit to internal medicine service. Consult placed to unassigned.  1:50 AM [facility]     MDM Rules/Calculators/A&P                       78 year old male presenting with expressive aphasia, found to have 11 mL core infarct in the left frontal operculum with surrounding ischemic penumbra.  No LVO.  Patient not a candidate for TPA; outside window.  [facility].  MRI brain ordered.  VSS.   Final Clinical Impression(s) / ED Diagnoses Final diagnoses:  Cerebrovascular accident (CVA), unspecified mechanism [facility](HCC)    Rx / DC Orders ED Discharge Orders    None       Antony MaduraHumes, Steffie Waggoner, PA-C 12/19/19 0152    Dione BoozeGlick, David, MD 12/19/19 [phone]

## 2019-12-19 NOTE — Code Documentation (Signed)
Responded to Code stroke called at 2321 for aphasia, LSN-1500. Pt arrived at 2345, NIH-7 for aphasia/dysarthria, CBG-100. CT head-Acute or early subacute ischemia of the anterior left MCA distribution, involving the left frontal operculum. No hemorrhage.  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 CTA/CTP-no LVO. Plan to admit to medical team.

## 2019-12-19 NOTE — ED Triage Notes (Signed)
Pt BIB [facility] for code stroke from home. LKW was 1500. Pt's wife reported pt started to get confused around 1500, by this evening pt had aphasia and the wife called EMS.

## 2019-12-19 NOTE — Consult Note (Signed)
Physical Medicine and Rehabilitation Consult   Reason for Consult: Stroke with functional deficits.  Referring Physician: Dr. Caleb Popp   HPI: Raymond Vance is a 78 y.o. male with history of COPD, HTN, STEMI who was admitted on 12/19/19 with difficulty speaking and confusion. Patient mute at admission and CTA/perfusion was negative for large vessel occlusion with left frontal core infarct and 25 ml ischemic penumbra, PCA diminutive or absent, severe right V4 stenosis of VA and moderate stenosis distal R-MCA M1 segment.  Respiratory panel  Negative.  Patient not felt to be tPA candidate as out of time criteria and started on ASA after 600 mg load.  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 2D echo showed EF 20-25% with moderate increased LVH and global hypokinesis and mild aortic sclerosis with trivial regurgitation. Dr. Roda Shutters recommended [facility] in 3 days due to moderate size infarct as stroke felt to be cardioembolic due to CM. Bedside swallow done and he was started dysphagia 3 ,thins. Therapy  Evaluations completed and patient with limitations in mobility and ADLs. CIR recommended due to functional decline.    Per Neurologist and pt, no BM since admission-   Review of Systems  Unable to perform ROS: Patient nonverbal  Respiratory: Negative for shortness of breath.   Cardiovascular: Negative for chest pain.      Past Medical History:  Diagnosis Date  . COPD (chronic obstructive pulmonary disease) (HCC)   . Hypertension   . STEMI (ST elevation myocardial infarction) [facility])     Past Surgical History:  Procedure Laterality Date  . CATARACT EXTRACTION    . CORONARY ARTERY BYPASS GRAFT      Family History  Family history unknown: Yes    Social History:  Lives with wife? Retired. He smokes 1 PPD. Denies alcohol or drug use.     Allergies  Allergen Reactions  . Pneumococcal Vaccine     Other  reaction(s): Cough (ALLERGY/intolerance), Other (See Comments)    Medications Prior to Admission  Medication Sig Dispense Refill  . albuterol (VENTOLIN HFA) 108 (90 Base) MCG/ACT inhaler Inhale 2 puffs into the lungs every 6 (six) hours as needed for wheezing or shortness of breath.    Marland Kitchen aspirin 81 MG EC tablet Take 81 mg by mouth daily.    Marland Kitchen atorvastatin (LIPITOR) 40 MG tablet Take 40 mg by mouth daily.    . budesonide-formoterol (SYMBICORT) 160-4.5 MCG/ACT inhaler Inhale 2 puffs into the lungs 2 (two) times daily.    . hydrochlorothiazide (HYDRODIURIL) 25 MG tablet Take 25 mg by mouth daily.    Marland Kitchen lisinopril (ZESTRIL) 40 MG tablet Take 40 mg by mouth daily.    . metoprolol tartrate (LOPRESSOR) 50 MG tablet Take 50 mg by mouth daily.    Marland Kitchen omeprazole (PRILOSEC) 40 MG capsule Take 40 mg by mouth daily.    Marland Kitchen spironolactone (ALDACTONE) 25 MG tablet Take 25 mg by mouth daily.      Home: Home Living Family/patient expects to be discharged to:: Private residence Living Arrangements: Spouse/significant other Additional Comments: pt with expressive difficulties, unable to provide home setup, did nod head yes when asked if he lived with his wife  Also said lived in [place] in house with no steps to enter; quit smoking.  Functional History: Prior Function Level of Independence: Independent Comments: pt nodded head yes when asked if he was independent at baseline, unsure if pt is a  reliable historian Functional Status:  Mobility: Bed Mobility Overal bed mobility: Needs Assistance Bed Mobility: Supine to Sit Supine to sit: Supervision General bed mobility comments: increased time needed and took obvious effort for pt. HR increased to mid 80's with sitting up.  Transfers Overall transfer level: Needs assistance Equipment used: Rolling walker (2 wheeled) Transfers: Sit to/from Stand Sit to Stand: Min assist General transfer comment: minA for safety Ambulation/Gait Ambulation/Gait  assistance: Min assist Gait Distance (Feet): 30 Feet Assistive device: Rolling walker (2 wheeled) Gait Pattern/deviations: Step-through pattern, Decreased stride length General Gait Details: pt began seemingly steady with RW but after 15' was less responsive and too close to objects on R side. Increased WOB noted, HR increased to 120's and highest seen was 170's as he got back in bed. Ambulation terminated due to instability of HR Gait velocity: decreased Gait velocity interpretation: <1.8 ft/sec, indicate of risk for recurrent falls    ADL: ADL Overall ADL's : Needs assistance/impaired Eating/Feeding: NPO Grooming: Min guard Upper Body Bathing: Min guard Lower Body Bathing: Minimal assistance, Sit to/from stand Upper Body Dressing : Min guard, Sitting Lower Body Dressing: Minimal assistance, Sit to/from stand Toilet Transfer: Minimal assistance, Ambulation Toileting- Clothing Manipulation and Hygiene: Minimal assistance, Sit to/from stand Functional mobility during ADLs: Minimal assistance, Rolling walker General ADL Comments: minA for safety  Cognition: Cognition Overall Cognitive Status: Difficult to assess Orientation Level: Oriented X4 Cognition Arousal/Alertness: Awake/alert Behavior During Therapy: WFL for tasks assessed/performed Overall Cognitive Status: Difficult to assess General Comments: pt nodding head yes and no, unsure accuracy of answers Difficult to assess due to: Impaired communication   Blood pressure (!) 141/78, pulse 64, temperature 98.1 F (36.7 C), temperature source Oral, resp. rate 18, height 5\' 11"  (1.803 m), weight 97.8 kg, SpO2 98 %. Physical Exam  Nursing note and vitals reviewed. Constitutional: He appears well-developed and well-nourished. No distress.  Awake, alert, appropriate, NAD, sitting up in bed, was able to speak somewhat, but not normal speech today  HENT:  EOMI B/L No facial asymmetry  Eyes: EOM are normal. Right eye exhibits no  discharge. Left eye exhibits no discharge.  Neck: No tracheal deviation present.  Cardiovascular: Regular rhythm.  Borderline bradycardia  Respiratory:  CTA B/L except mild coarseness in L lower lobe  GI: Soft. He exhibits distension. There is no abdominal tenderness.  Hypoactive BS, NT  Musculoskeletal:     Cervical back: Normal range of motion and neck supple.     Comments: At least 4/5 in UEs and LEs- hard to tell due to difficulty with following commands intermittently due to perseveration- would keep doing previous task when moved on to testing new muscles. 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  Non-verbal. He was able to nod appropriately to  Y/N question with minimal cues. Able to point and follow simple one step motor commands. Unable to follow two step commands.  Was able to say was in hospital- not particular location Told us his name Couldn't say age Knew had stroke when given 3 options Named thumb, but not 2 other objects Perseverating on commands Repeated "today is a nice day" haltingly   Skin: Skin is warm and dry. He is not diaphoretic.  Psychiatric: He has a normal mood and affect.    No results found for this or any previous visit (from the past 24 hour(s)). CT Code Stroke CTA Head W/WO contrast  Result Date: 12/19/2019 CLINICAL DATA:  Aphasia EXAM: CT ANGIOGRAPHY  HEAD AND NECK CT PERFUSION BRAIN TECHNIQUE: Multidetector CT imaging of the head and neck was performed using the standard protocol during bolus administration of intravenous contrast. Multiplanar CT image reconstructions and MIPs were obtained to evaluate the vascular anatomy. Carotid stenosis measurements (when applicable) are obtained utilizing NASCET criteria, using the distal internal carotid diameter as the denominator. Multiphase CT imaging of the brain was performed following IV bolus contrast injection. Subsequent parametric perfusion maps were calculated using RAPID  software. CONTRAST:  OMNIPAQUE IOHEXOL 350 MG/ML SOLN COMPARISON:  None. FINDINGS: CTA NECK FINDINGS SKELETON: There is no bony spinal canal stenosis. No lytic or blastic lesion. OTHER NECK: Normal pharynx, larynx and major salivary glands. No cervical lymphadenopathy. Unremarkable thyroid gland. UPPER CHEST: No pneumothorax or pleural effusion. No nodules or masses. AORTIC ARCH: There is no calcific atherosclerosis of the aortic arch. There is no aneurysm, dissection or hemodynamically significant stenosis of the visualized portion of the aorta. Conventional 3 vessel aortic branching pattern. The visualized proximal subclavian arteries are widely patent. RIGHT CAROTID SYSTEM: No dissection, occlusion or aneurysm. Mild atherosclerotic calcification at the carotid bifurcation without hemodynamically significant stenosis. LEFT CAROTID SYSTEM: No dissection, occlusion or aneurysm. Mild atherosclerotic calcification at the carotid bifurcation without hemodynamically significant stenosis. VERTEBRAL ARTERIES: Left dominant configuration. Moderate calcification of the left vertebral artery origin. There is no dissection, occlusion or flow-limiting stenosis to the skull base (V1-V3 segments). CTA HEAD FINDINGS POSTERIOR CIRCULATION: --Vertebral arteries: Bilateral atherosclerotic calcification of the V4 segments with severe stenosis on the right. No stenosis on the left. --Posterior inferior cerebellar arteries (PICA): Patent origins from the vertebral arteries. --Anterior inferior cerebellar arteries (AICA): Patent origins from the basilar artery. --Basilar artery: Normal. --Superior cerebellar arteries: Normal. --Posterior cerebral arteries: Normal. Both originate from the basilar artery. Posterior communicating arteries (p-comm) are diminutive or absent. ANTERIOR CIRCULATION: --Intracranial internal carotid arteries: Atherosclerotic calcification of the internal carotid arteries at the skull base without  hemodynamically significant stenosis. --Anterior cerebral arteries (ACA): Normal. Both A1 segments are present. Patent anterior communicating artery (a-comm). --Middle cerebral arteries (MCA): There is moderate stenosis of the distal right MCA M1 segment. There is no proximal occlusion. There is mild narrowing of the distal left M1 segment. The M2 branches are patent. There is tapering of an M3 branch in the area of the frontal operculum infarct. VENOUS SINUSES: As permitted by contrast timing, patent. ANATOMIC VARIANTS: None Review of the MIP images confirms the above findings. CT Brain Perfusion Findings: ASPECTS: 8 CBF (<30%) Volume: 11mL Perfusion (Tmax>6.0s) volume: 36mL Mismatch Volume: 25mL Infarction Location:Left frontal operculum IMPRESSION: 1. No emergent large vessel occlusion. 2. 11 mL core infarct in the left frontal operculum with surrounding 25 mL ischemic penumbra. 3. Mild narrowing of the distal left M1 segment with tapering of an M3 branch in the area of the frontal operculum infarct. 4. 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 5. Mild bilateral carotid bifurcation atherosclerosis without hemodynamically significant stenosis. 6. Moderate stenosis of the distal right MCA M1 segment. These results were called by telephone at the time of interpretation on 12/19/2019 at 12:30 am to provider ERIC [facility] , who verbally acknowledged these results. Electronically Signed   By: Deatra Robinson M.D.   On: 12/19/2019 00:35   DG Chest 2 View  Result Date: 12/19/2019 CLINICAL DATA:  Infarct EXAM: CHEST - 2 VIEW COMPARISON:  None. FINDINGS: Mild cardiomegaly and remote median sternotomy. No focal airspace consolidation or pulmonary edema. No pleural effusion or  pneumothorax. IMPRESSION: No active cardiopulmonary disease. Electronically Signed   By: Deatra Robinson M.D.   On: 12/19/2019 03:00   CT Code Stroke CTA Neck W/WO contrast  Result Date: 12/19/2019 CLINICAL DATA:  Aphasia  EXAM: CT ANGIOGRAPHY HEAD AND NECK CT PERFUSION BRAIN TECHNIQUE: Multidetector CT imaging of the head and neck was performed using the standard protocol during bolus administration of intravenous contrast. Multiplanar CT image reconstructions and MIPs were obtained to evaluate the vascular anatomy. Carotid stenosis measurements (when applicable) are obtained utilizing NASCET criteria, using the distal internal carotid diameter as the denominator. Multiphase CT imaging of the brain was performed following IV bolus contrast injection. Subsequent parametric perfusion maps were calculated using RAPID software. CONTRAST:  OMNIPAQUE IOHEXOL 350 MG/ML SOLN COMPARISON:  None. FINDINGS: CTA NECK FINDINGS SKELETON: There is no bony spinal canal stenosis. No lytic or blastic lesion. OTHER NECK: Normal pharynx, larynx and major salivary glands. No cervical lymphadenopathy. Unremarkable thyroid gland. UPPER CHEST: No pneumothorax or pleural effusion. No nodules or masses. AORTIC ARCH: There is no calcific atherosclerosis of the aortic arch. There is no aneurysm, dissection or hemodynamically significant stenosis of the visualized portion of the aorta. Conventional 3 vessel aortic branching pattern. The visualized proximal subclavian arteries are widely patent. RIGHT CAROTID SYSTEM: No dissection, occlusion or aneurysm. Mild atherosclerotic calcification at the carotid bifurcation without hemodynamically significant stenosis. LEFT CAROTID SYSTEM: No dissection, occlusion or aneurysm. Mild atherosclerotic calcification at the carotid bifurcation without hemodynamically significant stenosis. VERTEBRAL ARTERIES: Left dominant configuration. Moderate calcification of the left vertebral artery origin. There is no dissection, occlusion or flow-limiting stenosis to the skull base (V1-V3 segments). CTA HEAD FINDINGS POSTERIOR CIRCULATION: --Vertebral arteries: Bilateral atherosclerotic calcification of the V4 segments with severe  stenosis on the right. No stenosis on the left. --Posterior inferior cerebellar arteries (PICA): Patent origins from the vertebral arteries. --Anterior inferior cerebellar arteries (AICA): Patent origins from the basilar artery. --Basilar artery: Normal. --Superior cerebellar arteries: Normal. --Posterior cerebral arteries: Normal. Both originate from the basilar artery. Posterior communicating arteries (p-comm) are diminutive or absent. ANTERIOR CIRCULATION: --Intracranial internal carotid arteries: Atherosclerotic calcification of the internal carotid arteries at the skull base without hemodynamically significant stenosis. --Anterior cerebral arteries (ACA): Normal. Both A1 segments are present. Patent anterior communicating artery (a-comm). --Middle cerebral arteries (MCA): There is moderate stenosis of the distal right MCA M1 segment. There is no proximal occlusion. There is mild narrowing of the distal left M1 segment. The M2 branches are patent. There is tapering of an M3 branch in the area of the frontal operculum infarct. VENOUS SINUSES: As permitted by contrast timing, patent. ANATOMIC VARIANTS: None Review of the MIP images confirms the above findings. CT Brain Perfusion Findings: ASPECTS: 8 CBF (<30%) Volume: 42mL Perfusion (Tmax>6.0s) volume: 60mL Mismatch Volume: 43mL Infarction Location:Left frontal operculum IMPRESSION: 1. No emergent large vessel occlusion. 2. 11 mL core infarct in the left frontal operculum with surrounding 25 mL ischemic penumbra. 3. Mild narrowing of the distal left M1 segment with tapering of an M3 branch in the area of the frontal operculum infarct. 4. 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 5. Mild bilateral carotid bifurcation atherosclerosis without hemodynamically significant stenosis. 6. Moderate stenosis of the distal right MCA M1 segment. These results were called by telephone at the time of interpretation on 12/19/2019 at 12:30 am to provider  ERIC [facility] , who verbally acknowledged these results. Electronically Signed   By: Deatra Robinson M.D.   On: 12/19/2019  00:35   MR BRAIN WO CONTRAST  Result Date: 12/19/2019 CLINICAL DATA:  Acute onset of aphasia. EXAM: MRI HEAD WITHOUT CONTRAST TECHNIQUE: Multiplanar, multiecho pulse sequences of the brain and surrounding structures were obtained without intravenous contrast. COMPARISON:  Head CT 12/19/2019 FINDINGS: BRAIN: Intermediate sized acute infarct of the left frontal operculum. Early confluent hyperintense T2-weighted signal of the periventricular and deep white matter, most commonly due to chronic ischemic microangiopathy. 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 The cerebral and cerebellar volume are age-appropriate. There is no hydrocephalus. The midline structures are normal. VASCULAR: The major intracranial arterial and venous sinus flow voids are normal. Hemosiderin deposition at the site of old left parietal infarct. No acute hemorrhage. SKULL AND UPPER CERVICAL SPINE: Calvarial bone marrow signal is normal. There is no skull base mass. The visualized upper cervical spine and soft tissues are normal. SINUSES/ORBITS: Mild left maxillary sinus mucosal thickening. The orbits are normal. IMPRESSION: 1. Intermediate sized acute infarct of the left frontal operculum. No hemorrhage or mass effect. 2. 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 3. Moderate chronic small vessel disease. Electronically Signed   By: Deatra Robinson M.D.   On: 12/19/2019 02:49   CT Code Stroke Cerebral Perfusion with contrast  Result Date: 12/19/2019 CLINICAL DATA:  Aphasia EXAM: CT ANGIOGRAPHY HEAD AND NECK CT PERFUSION BRAIN TECHNIQUE: Multidetector CT imaging of the head and neck was performed using the standard protocol during bolus administration of intravenous contrast. Multiplanar CT image reconstructions and MIPs were obtained to evaluate the vascular anatomy. Carotid stenosis  measurements (when applicable) are obtained utilizing NASCET criteria, using the distal internal carotid diameter as the denominator. Multiphase CT imaging of the brain was performed following IV bolus contrast injection. Subsequent parametric perfusion maps were calculated using RAPID software. CONTRAST:  OMNIPAQUE IOHEXOL 350 MG/ML SOLN COMPARISON:  None. FINDINGS: CTA NECK FINDINGS SKELETON: There is no bony spinal canal stenosis. No lytic or blastic lesion. OTHER NECK: Normal pharynx, larynx and major salivary glands. No cervical lymphadenopathy. Unremarkable thyroid gland. UPPER CHEST: No pneumothorax or pleural effusion. No nodules or masses. AORTIC ARCH: There is no calcific atherosclerosis of the aortic arch. There is no aneurysm, dissection or hemodynamically significant stenosis of the visualized portion of the aorta. Conventional 3 vessel aortic branching pattern. The visualized proximal subclavian arteries are widely patent. RIGHT CAROTID SYSTEM: No dissection, occlusion or aneurysm. Mild atherosclerotic calcification at the carotid bifurcation without hemodynamically significant stenosis. LEFT CAROTID SYSTEM: No dissection, occlusion or aneurysm. Mild atherosclerotic calcification at the carotid bifurcation without hemodynamically significant stenosis. VERTEBRAL ARTERIES: Left dominant configuration. Moderate calcification of the left vertebral artery origin. There is no dissection, occlusion or flow-limiting stenosis to the skull base (V1-V3 segments). CTA HEAD FINDINGS POSTERIOR CIRCULATION: --Vertebral arteries: Bilateral atherosclerotic calcification of the V4 segments with severe stenosis on the right. No stenosis on the left. --Posterior inferior cerebellar arteries (PICA): Patent origins from the vertebral arteries. --Anterior inferior cerebellar arteries (AICA): Patent origins from the basilar artery. --Basilar artery: Normal. --Superior cerebellar arteries: Normal. --Posterior cerebral  arteries: Normal. Both originate from the basilar artery. Posterior communicating arteries (p-comm) are diminutive or absent. ANTERIOR CIRCULATION: --Intracranial internal carotid arteries: Atherosclerotic calcification of the internal carotid arteries at the skull base without hemodynamically significant stenosis. --Anterior cerebral arteries (ACA): Normal. Both A1 segments are present. Patent anterior communicating artery (a-comm). --Middle cerebral arteries (MCA): There is moderate stenosis of the distal right MCA M1 segment. There is no proximal occlusion. There  is mild narrowing of the distal left M1 segment. The M2 branches are patent. There is tapering of an M3 branch in the area of the frontal operculum infarct. VENOUS SINUSES: As permitted by contrast timing, patent. ANATOMIC VARIANTS: None Review of the MIP images confirms the above findings. CT Brain Perfusion Findings: ASPECTS: 8 CBF (<30%) Volume: 11mL Perfusion (Tmax>6.0s) volume: 36mL Mismatch Volume: 25mL Infarction Location:Left frontal operculum IMPRESSION: 1. No emergent large vessel occlusion. 2. 11 mL core infarct in the left frontal operculum with surrounding 25 mL ischemic penumbra. 3. Mild narrowing of the distal left M1 segment with tapering of an M3 branch in the area of the frontal operculum infarct. 4. 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 5. Mild bilateral carotid bifurcation atherosclerosis without hemodynamically significant stenosis. 6. Moderate stenosis of the distal right MCA M1 segment. These results were called by telephone at the time of interpretation on 12/19/2019 at 12:30 am to provider ERIC [facility] , who verbally acknowledged these results. Electronically Signed   By: Deatra Robinson M.D.   On: 12/19/2019 00:35   ECHOCARDIOGRAM COMPLETE  Result Date: 12/19/2019   ECHOCARDIOGRAM REPORT   Patient Name:   RONEY YOUTZ Date of Exam: 12/19/2019 Medical Rec #:  [id number]    Height:       71.0 in  Accession #:    [id number]   Weight:       215.6 lb Date of Birth:  01-25-1941    BSA:          2.18 m Patient Age:    78 years     BP:           118/64 mmHg Patient Gender: M            HR:           37 bpm. Exam Location:  Inpatient Procedure: 2D Echo, Cardiac Doppler, Color Doppler and Intracardiac            Opacification Agent Indications:    Stroke  History:        Patient has no prior history of Echocardiogram examinations. CAD                 and Previous Myocardial Infarction, Prior CABG and Abnormal ECG,                 Stroke and COPD; Risk Factors:Hypertension.  Sonographer:    Sheralyn Boatman RDCS Referring Phys: [phone] JARED M GARDNER  Sonographer Comments: Technically difficult study due to poor echo windows, no subcostal window, suboptimal subcostal window and suboptimal apical window. IMPRESSIONS  1. Left ventricular ejection fraction, by visual estimation, is 20 to 25%. The left ventricle has severely decreased function. There is moderately increased left ventricular hypertrophy.  2. Definity contrast agent was given IV to delineate the left ventricular endocardial borders. No mural thrombus was noted.  3. Left ventricular diastolic function could not be evaluated.  4. The left ventricle demonstrates global hypokinesis.  5. Global right ventricle has normal systolic function.The right ventricular size is normal. No increase in right ventricular wall thickness.  6. Left atrial size was moderately dilated.  7. Right atrial size was mildly dilated.  8. The mitral valve is grossly normal. Trivial mitral valve regurgitation.  9. The tricuspid valve is grossly normal. 10. The aortic valve is tricuspid. Aortic valve regurgitation is trivial. Mild aortic valve sclerosis without stenosis. 11. The pulmonic valve was not well visualized. Pulmonic valve regurgitation is  not visualized. 12. Aortic dilatation noted. 13. 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 FINDINGS  Left Ventricle: Left  ventricular ejection fraction, by visual estimation, is 20 to 25%. The left ventricle has severely decreased function. Definity contrast agent was given IV to delineate the left ventricular endocardial borders. The left ventricle demonstrates global hypokinesis. There is moderately increased left ventricular hypertrophy. The left ventricular diastology could not be evaluated due to indeterminate diastolic function. Left ventricular diastolic function could not be evaluated. Right Ventricle: The right ventricular size is normal. No increase in right ventricular wall thickness. Global RV systolic function is has normal systolic function. Left Atrium: Left atrial size was moderately dilated. Right Atrium: Right atrial size was mildly dilated Pericardium: There is no evidence of pericardial effusion. Mitral Valve: The mitral valve is grossly normal. There is mild thickening of the mitral valve leaflet(s). Trivial mitral valve regurgitation. Tricuspid Valve: The tricuspid valve is grossly normal. Tricuspid valve regurgitation is trivial. Aortic Valve: The aortic valve is tricuspid. Aortic valve regurgitation is trivial. Mild aortic valve sclerosis is present, with no evidence of aortic valve stenosis. Aortic valve mean gradient measures 7.5 mmHg. Aortic valve peak gradient measures 15.4 mmHg. Aortic valve area, by VTI measures 4.38 cm. Pulmonic Valve: The pulmonic valve was not well visualized. Pulmonic valve regurgitation is not visualized. Pulmonic regurgitation is not visualized. Aorta: Aortic dilatation noted. 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 Venous: The inferior vena cava was not well visualized. IAS/Shunts: No atrial level shunt detected by color flow Doppler.  LEFT VENTRICLE PLAX 2D LVIDd:         6.60 cm LVIDs:         5.90 cm LV PW:         1.50 cm LV IVS:        1.60 cm LVOT diam:     2.70 cm LV SV:         50 ml LV SV Index:   22.53 LVOT Area:     5.73 cm  LV Volumes (MOD) LV  area d, A4C:    52.90 cm LV area s, A4C:    44.30 cm LV major d, A4C:   9.50 cm LV major s, A4C:   8.62 cm LV vol d, MOD A4C: 226.0 ml LV vol s, MOD A4C: 175.0 ml LV SV MOD A4C:     226.0 ml RIGHT VENTRICLE         IVC TAPSE (M-mode): 1.7 cm  IVC diam: 2.70 cm LEFT ATRIUM              Index       RIGHT ATRIUM           Index LA diam:        4.10 cm  1.88 cm/m  RA Area:     23.60 cm LA Vol (A2C):   104.0 ml 47.77 ml/m RA Volume:   77.40 ml  35.55 ml/m LA Vol (A4C):   71.6 ml  32.89 ml/m LA Biplane Vol: 90.4 ml  41.53 ml/m  AORTIC VALVE AV Area (Vmax):    4.27 cm AV Area (Vmean):   4.53 cm AV Area (VTI):     4.38 cm AV Vmax:           196.50 cm/s AV Vmean:          125.000 cm/s AV VTI:            0.412  m AV Peak Grad:      15.4 mmHg AV Mean Grad:      7.5 mmHg LVOT Vmax:         146.50 cm/s LVOT Vmean:        98.950 cm/s LVOT VTI:          0.316 m LVOT/AV VTI ratio: 0.77  AORTA Ao Root diam: 3.60 cm Ao Asc diam:  4.00 cm MITRAL VALVE MV Area (PHT): 4.06 cm             SHUNTS MV PHT:        54.13 msec           Systemic VTI:  0.32 m MV Decel Time: 187 msec             Systemic Diam: 2.70 cm MV E velocity: 85.87 cm/s 103 cm/s MV A velocity: 83.90 cm/s 70.3 cm/s MV E/A ratio:  1.02       1.5  Zoila Shutter MD Electronically signed by Zoila Shutter MD Signature Date/Time: 12/19/2019/12:00:27 PM    Final    CT HEAD CODE STROKE WO CONTRAST  Result Date: 12/19/2019 CLINICAL DATA:  Code stroke.  Confusion and aphasia EXAM: CT HEAD WITHOUT CONTRAST TECHNIQUE: Contiguous axial images were obtained from the base of the skull through the vertex without intravenous contrast. COMPARISON:  None. FINDINGS: Brain: There is no mass, hemorrhage or extra-axial collection. The size and configuration of the ventricles and extra-axial CSF spaces are normal. There is hypoattenuation of the periventricular white matter, most commonly indicating chronic ischemic microangiopathy. There is an area of acute or early subacute  ischemia within the anterior left MCA distribution, involving the left frontal operculum. 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 Vascular: No abnormal hyperdensity of the major intracranial arteries or dural venous sinuses. No intracranial atherosclerosis. Skull: The visualized skull base, calvarium and extracranial soft tissues are normal. Sinuses/Orbits: Moderate left maxillary sinus mucosal thickening. The orbits are normal. ASPECTS [facility] Stroke Program Early CT Score) - Ganglionic level infarction (caudate, lentiform nuclei, internal capsule, insula, M1-M3 cortex): 6 - Supraganglionic infarction (M4-M6 cortex): 2 Total score (0-10 with 10 being normal): 8 IMPRESSION: 1. 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 2. No hemorrhage. 3. 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 4. ASPECTS is 8. 5. These results were communicated to Dr. Caryl Pina at 12:10 am on 12/19/2019 by text page via the [facility] messaging system. Electronically Signed   By: Deatra Robinson M.D.   On: 12/19/2019 00:12     Assessment/Plan: Diagnosis: L MCA infarct -embolic per d/w Neurology 1. Does the need for close, 24 hr/day medical supervision in concert with the patient's rehab needs make it unreasonable for this patient to be served in a less intensive setting? Yes 2. Co-Morbidities requiring supervision/potential complications:HTN, CAD s/p MI, hx of CABG, COPD, cataract surgery 2 weeks ago 3. Due to bladder management, bowel management, safety, disease management, medication administration, pain management and patient education, does the patient require 24 hr/day rehab nursing? Yes 4. Does the patient require coordinated care of a physician, rehab nurse, therapy disciplines of PT/OT/SLP to address physical and functional deficits in the context of the above medical diagnosis(es)? Yes Addressing deficits in the following  areas: balance, endurance, locomotion, transferring, bathing, dressing, grooming, toileting, cognition, speech and language 5. Can the patient actively participate in an intensive therapy program of at least 3 hrs of therapy per  day at least 5 days per week? Yes 6. The potential for patient to make measurable gains while on inpatient rehab is good 7. Anticipated functional outcomes upon discharge from inpatient rehab are modified independent  with PT, modified independent with OT, supervision and min assist with SLP. 8. Estimated rehab length of stay to reach the above functional goals is: 10-14 days 9. Anticipated discharge destination: Home 10. Overall Rehab/Functional Prognosis: good  RECOMMENDATIONS: This patient's condition is appropriate for continued rehabilitative care in the following setting: CIR Patient has agreed to participate in recommended program. Yes Note that insurance prior authorization may be required for reimbursement for recommended care.  Comment:  1. Strongly suggest as discussed with Neurology, who suggested starting Eliquis 2. Also d/w Neurology, addition of Amantadine 100 mg daily x 4 days then 100 mg in AM and at lunch- for expressive aphasia as well as initiation-  3. Of note, pt also having perseveration- was unable to tell if having ataxia/apraxia today due to perseveration- might also need those issues addressed 4. Pt noted that he had quit smoking- hopefully, this is correct, since smoking is a risk factor for another stroke. Discussed this with patient.   Jacquelynn Cree, PA-C 12/20/2019

## 2019-12-19 NOTE — Progress Notes (Addendum)
Occupational Therapy Evaluation Patient Details Name: Raymond Vance MRN: [id number] DOB: May 21, 1941 Today's Date: 12/19/2019    History of Present Illness  Raymond Vance is a 78 y.o. male with medical history significant of HTN, CAD s/p CABG.  Patient presents to ED via EMS with acute onset of aphasia. MRI showed acute CVA of the frontal operculum as well as past L parietal and R cerebellum.    Clinical Impression   PTA, pt was living at home with wife, unable to get full history and home setup from pt due to expressive aphasia. Upon arrival, pt asleep in bed HR 47bpm. Pt awoke to therapist knocking, HR 59bpm. Pt currently requires minA for ADL and functional mobility. During ambulation in the hallway, pt bumped into objects on the right side. Pt became symptomatic with labored breathing and tachy HR up to 177, pt returned to bed HR 77bpm, RN notified. BP 120/63. SpO2 appeared to be within normal range throughout session.  Due to decline in current level of function, pt would benefit from acute OT to address established goals to facilitate safe D/C to venue listed below. At this time, recommend CIR follow-up. Will continue to follow acutely.     Follow Up Recommendations  CIR    Equipment Recommendations  None recommended by OT    Recommendations for Other Services       Precautions / Restrictions Precautions Precautions: Fall Precaution Comments: watch HR  Restrictions Weight Bearing Restrictions: No      Mobility Bed Mobility                  Transfers Overall transfer level: Needs assistance Equipment used: Rolling walker (2 wheeled) Transfers: Sit to/from Stand Sit to Stand: Min assist         General transfer comment: minA for safety    Balance Overall balance assessment: Needs assistance Sitting-balance support: No upper extremity supported;Feet supported Sitting balance-Leahy Scale: Fair     Standing balance support: Bilateral upper extremity  supported Standing balance-Leahy Scale: Fair Standing balance comment: BUE support on RW                           ADL either performed or assessed with clinical judgement   ADL Overall ADL's : Needs assistance/impaired Eating/Feeding: NPO   Grooming: Min guard   Upper Body Bathing: Min guard   Lower Body Bathing: Minimal assistance;Sit to/from stand   Upper Body Dressing : Min guard;Sitting   Lower Body Dressing: Minimal assistance;Sit to/from stand   Toilet Transfer: Minimal assistance;Ambulation   Toileting- Clothing Manipulation and Hygiene: Minimal assistance;Sit to/from stand       Functional mobility during ADLs: Minimal assistance;Rolling walker General ADL Comments: minA for safety     Vision Baseline Vision/History: Wears glasses Wears Glasses: At all times Patient Visual Report: No change from baseline Vision Assessment?: Vision impaired- to be further tested in functional context Additional Comments: continue to asses, pt nodded to no change in vision, but during mobilty in the hall did not appear to see items on R side of environment     Perception     Praxis      Pertinent Vitals/Pain Pain Assessment: Faces Faces Pain Scale: No hurt Pain Intervention(s): Monitored during session     Hand Dominance     Extremity/Trunk Assessment Upper Extremity Assessment Upper Extremity Assessment: Generalized weakness   Lower Extremity Assessment Lower Extremity Assessment: Defer to PT evaluation   Cervical / Trunk  Assessment Cervical / Trunk Assessment: Normal   Communication Communication Communication: Expressive difficulties   Cognition Arousal/Alertness: Awake/alert Behavior During Therapy: WFL for tasks assessed/performed Overall Cognitive Status: Difficult to assess                                 General Comments: pt nodding head yes and no, unsure accuracy of answers   General Comments  pt with wheezing noted, nodded  head no when asked if it was new    Exercises     Shoulder Instructions      Home Living Family/patient expects to be discharged to:: Private residence Living Arrangements: Spouse/significant other                               Additional Comments: pt with expressive difficulties, unable to provide home setup, did nod head yes when asked if he lived with his wife      Prior Functioning/Environment Level of Independence: Independent        Comments: pt nodded head yes when asked if he was independent at baseline, unsure if pt is a reliable historian        OT Problem List: Decreased activity tolerance;Impaired balance (sitting and/or standing);Decreased safety awareness;Decreased knowledge of use of DME or AE;Decreased knowledge of precautions;Cardiopulmonary status limiting activity      OT Treatment/Interventions: Self-care/ADL training;Therapeutic exercise;Energy conservation;DME and/or AE instruction;Cognitive remediation/compensation;Patient/family education;Balance training;Visual/perceptual remediation/compensation    OT Goals(Current goals can be found in the care plan section) Acute Rehab OT Goals Patient Stated Goal: pt did not stated OT Goal Formulation: Patient unable to participate in goal setting Time For Goal Achievement: 01/02/20 Potential to Achieve Goals: Good ADL Goals Pt Will Perform Grooming: with modified independence;standing Pt Will Perform Upper Body Dressing: with modified independence;sitting Pt Will Perform Lower Body Dressing: with modified independence;sit to/from stand Pt Will Transfer to Toilet: with modified independence;ambulating  OT Frequency: Min 2X/week   Barriers to D/C: Decreased caregiver support  pt lives with wife       Co-evaluation              AM-PAC OT "6 Clicks" Daily Activity     Outcome Measure Help from another person eating meals?: Total(NPO) Help from another person taking care of personal  grooming?: A Little Help from another person toileting, which includes using toliet, bedpan, or urinal?: A Little Help from another person bathing (including washing, rinsing, drying)?: A Little Help from another person to put on and taking off regular upper body clothing?: A Little Help from another person to put on and taking off regular lower body clothing?: A Little 6 Click Score: 16   End of Session Equipment Utilized During Treatment: Gait belt;Rolling walker Nurse Communication: Mobility status(vitals)  Activity Tolerance: Treatment limited secondary to medical complications (Comment);Patient tolerated treatment well(tachy HR) Patient left: in bed;with call bell/phone within reach;with bed alarm set  OT Visit Diagnosis: Unsteadiness on feet (R26.81);Other abnormalities of gait and mobility (R26.89);Muscle weakness (generalized) (M62.81);Other symptoms and signs involving cognitive function                Time: 1012-1030 OT Time Calculation (min): 18 min Charges:  OT General Charges $OT Visit: 1 Visit OT Evaluation $OT Eval Moderate Complexity: 1 Mod  Diona Browner OTR/L Acute Rehabilitation Services Office: [phone]   Rebeca Alert 12/19/2019, 12:43 PM

## 2019-12-19 NOTE — Progress Notes (Signed)
Rehab Admissions Coordinator Note:  Per PT and OT recommendation, this patient was screened by Raechel Ache for appropriateness for an Inpatient Acute Rehab Consult.    At this time, we are recommending an Inpatient Rehab consult. AC will place order in the chart to allow for further assessment and possible rehab admit.  Raechel Ache 12/19/2019, 3:45 PM  I can be reached at [phone].

## 2019-12-19 NOTE — Progress Notes (Signed)
Patient has severe expressive aphasia and receptive ,unable to complete Admission data.

## 2019-12-19 NOTE — ED Notes (Signed)
Pt transported to MRI 

## 2019-12-19 NOTE — Progress Notes (Signed)
STROKE TEAM PROGRESS NOTE   SUBJECTIVE (INTERVAL HISTORY) No family is at the bedside.  Overall his condition is unchanged. He still has more expressive aphasia, follows most of the simple commands but missed some of them. No motor difficulty so far. MRI showed left MCA infarct. Has not passed swallow yet. On IVF. Tele showed frequent PACs and PVCs.    OBJECTIVE Temp:  [97.7 F (36.5 C)-98.2 F (36.8 C)] 97.7 F (36.5 C) (12/28 0735) Pulse Rate:  [29-73] 57 (12/28 1157) Cardiac Rhythm: Sinus bradycardia;Heart block (12/28 1144) Resp:  [11-21] 20 (12/28 1157) BP: (108-136)/(58-87) 122/65 (12/28 1157) SpO2:  [94 %-98 %] 97 % (12/28 1157) Weight:  [97.8 kg-99.2 kg] 97.8 kg (12/28 0359)  Recent Labs  Lab 12/18/19 2350  GLUCAP 100*   Recent Labs  Lab 12/18/19 2351 12/18/19 2356  NA 136 136  K 4.5 4.4  CL 103 104  CO2 21*  --   GLUCOSE 103* 99  BUN 28* 31*  CREATININE 1.24 1.10  CALCIUM 9.1  --    Recent Labs  Lab 12/18/19 2351  AST 16  ALT 19  ALKPHOS 50  BILITOT 0.7  PROT 6.5  ALBUMIN 3.8   Recent Labs  Lab 12/18/19 2351 12/18/19 2356  WBC 7.5  --   NEUTROABS 4.2  --   HGB 16.9 17.3*  HCT 51.7 51.0  MCV 93.2  --   PLT 169  --    No results for input(s): CKTOTAL, CKMB, CKMBINDEX, TROPONINI in the last 168 hours. Recent Labs    12/18/19 2351  LABPROT 12.5  INR 1.0   No results for input(s): COLORURINE, LABSPEC, PHURINE, GLUCOSEU, HGBUR, BILIRUBINUR, KETONESUR, PROTEINUR, UROBILINOGEN, NITRITE, LEUKOCYTESUR in the last 72 hours.  Invalid input(s): APPERANCEUR     Component Value Date/Time   CHOL 179 12/19/2019 0332   TRIG 87 12/19/2019 0332   HDL 50 12/19/2019 0332   CHOLHDL 3.6 12/19/2019 0332   VLDL 17 12/19/2019 0332   LDLCALC 112 (H) 12/19/2019 0332   Lab Results  Component Value Date   HGBA1C 6.2 (H) 12/19/2019   No results found for: LABOPIA, COCAINSCRNUR, LABBENZ, AMPHETMU, THCU, LABBARB  No results for input(s): ETH in the last 168  hours.  I have personally reviewed the radiological images below and agree with the radiology interpretations.  CT Code Stroke CTA Head W/WO contrast  Result Date: 12/19/2019 CLINICAL DATA:  Aphasia EXAM: CT ANGIOGRAPHY HEAD AND NECK CT PERFUSION BRAIN TECHNIQUE: Multidetector CT imaging of the head and neck was performed using the standard protocol during bolus administration of intravenous contrast. Multiplanar CT image reconstructions and MIPs were obtained to evaluate the vascular anatomy. Carotid stenosis measurements (when applicable) are obtained utilizing NASCET criteria, using the distal internal carotid diameter as the denominator. Multiphase CT imaging of the brain was performed following IV bolus contrast injection. Subsequent parametric perfusion maps were calculated using RAPID software. CONTRAST:  OMNIPAQUE IOHEXOL 350 MG/ML SOLN COMPARISON:  None. FINDINGS: CTA NECK FINDINGS SKELETON: There is no bony spinal canal stenosis. No lytic or blastic lesion. OTHER NECK: Normal pharynx, larynx and major salivary glands. No cervical lymphadenopathy. Unremarkable thyroid gland. UPPER CHEST: No pneumothorax or pleural effusion. No nodules or masses. AORTIC ARCH: There is no calcific atherosclerosis of the aortic arch. There is no aneurysm, dissection or hemodynamically significant stenosis of the visualized portion of the aorta. Conventional 3 vessel aortic branching pattern. The visualized proximal subclavian arteries are widely patent. RIGHT CAROTID SYSTEM: No dissection, occlusion or  aneurysm. Mild atherosclerotic calcification at the carotid bifurcation without hemodynamically significant stenosis. LEFT CAROTID SYSTEM: No dissection, occlusion or aneurysm. Mild atherosclerotic calcification at the carotid bifurcation without hemodynamically significant stenosis. VERTEBRAL ARTERIES: Left dominant configuration. Moderate calcification of the left vertebral artery origin. There is no dissection,  occlusion or flow-limiting stenosis to the skull base (V1-V3 segments). CTA HEAD FINDINGS POSTERIOR CIRCULATION: --Vertebral arteries: Bilateral atherosclerotic calcification of the V4 segments with severe stenosis on the right. No stenosis on the left. --Posterior inferior cerebellar arteries (PICA): Patent origins from the vertebral arteries. --Anterior inferior cerebellar arteries (AICA): Patent origins from the basilar artery. --Basilar artery: Normal. --Superior cerebellar arteries: Normal. --Posterior cerebral arteries: Normal. Both originate from the basilar artery. Posterior communicating arteries (p-comm) are diminutive or absent. ANTERIOR CIRCULATION: --Intracranial internal carotid arteries: Atherosclerotic calcification of the internal carotid arteries at the skull base without hemodynamically significant stenosis. --Anterior cerebral arteries (ACA): Normal. Both A1 segments are present. Patent anterior communicating artery (a-comm). --Middle cerebral arteries (MCA): There is moderate stenosis of the distal right MCA M1 segment. There is no proximal occlusion. There is mild narrowing of the distal left M1 segment. The M2 branches are patent. There is tapering of an M3 branch in the area of the frontal operculum infarct. VENOUS SINUSES: As permitted by contrast timing, patent. ANATOMIC VARIANTS: None Review of the MIP images confirms the above findings. CT Brain Perfusion Findings: ASPECTS: 8 CBF (<30%) Volume: 11mL Perfusion (Tmax>6.0s) volume: 36mL Mismatch Volume: 25mL Infarction Location:Left frontal operculum IMPRESSION: 1. No emergent large vessel occlusion. 2. 11 mL core infarct in the left frontal operculum with surrounding 25 mL ischemic penumbra. 3. Mild narrowing of the distal left M1 segment with tapering of an M3 branch in the area of the frontal operculum infarct. 4. 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 5. Mild bilateral carotid bifurcation atherosclerosis without  hemodynamically significant stenosis. 6. Moderate stenosis of the distal right MCA M1 segment. These results were called by telephone at the time of interpretation on 12/19/2019 at 12:30 am to provider ERIC [facility] , who verbally acknowledged these results. Electronically Signed   By: Deatra Robinson M.D.   On: 12/19/2019 00:35   DG Chest 2 View  Result Date: 12/19/2019 CLINICAL DATA:  Infarct EXAM: CHEST - 2 VIEW COMPARISON:  None. FINDINGS: Mild cardiomegaly and remote median sternotomy. No focal airspace consolidation or pulmonary edema. No pleural effusion or pneumothorax. IMPRESSION: No active cardiopulmonary disease. Electronically Signed   By: Deatra Robinson M.D.   On: 12/19/2019 03:00   CT Code Stroke CTA Neck W/WO contrast  Result Date: 12/19/2019 CLINICAL DATA:  Aphasia EXAM: CT ANGIOGRAPHY HEAD AND NECK CT PERFUSION BRAIN TECHNIQUE: Multidetector CT imaging of the head and neck was performed using the standard protocol during bolus administration of intravenous contrast. Multiplanar CT image reconstructions and MIPs were obtained to evaluate the vascular anatomy. Carotid stenosis measurements (when applicable) are obtained utilizing NASCET criteria, using the distal internal carotid diameter as the denominator. Multiphase CT imaging of the brain was performed following IV bolus contrast injection. Subsequent parametric perfusion maps were calculated using RAPID software. CONTRAST:  OMNIPAQUE IOHEXOL 350 MG/ML SOLN COMPARISON:  None. FINDINGS: CTA NECK FINDINGS SKELETON: There is no bony spinal canal stenosis. No lytic or blastic lesion. OTHER NECK: Normal pharynx, larynx and major salivary glands. No cervical lymphadenopathy. Unremarkable thyroid gland. UPPER CHEST: No pneumothorax or pleural effusion. No nodules or masses. AORTIC ARCH: There is no calcific atherosclerosis of  the aortic arch. There is no aneurysm, dissection or hemodynamically significant stenosis of the visualized portion of  the aorta. Conventional 3 vessel aortic branching pattern. The visualized proximal subclavian arteries are widely patent. RIGHT CAROTID SYSTEM: No dissection, occlusion or aneurysm. Mild atherosclerotic calcification at the carotid bifurcation without hemodynamically significant stenosis. LEFT CAROTID SYSTEM: No dissection, occlusion or aneurysm. Mild atherosclerotic calcification at the carotid bifurcation without hemodynamically significant stenosis. VERTEBRAL ARTERIES: Left dominant configuration. Moderate calcification of the left vertebral artery origin. There is no dissection, occlusion or flow-limiting stenosis to the skull base (V1-V3 segments). CTA HEAD FINDINGS POSTERIOR CIRCULATION: --Vertebral arteries: Bilateral atherosclerotic calcification of the V4 segments with severe stenosis on the right. No stenosis on the left. --Posterior inferior cerebellar arteries (PICA): Patent origins from the vertebral arteries. --Anterior inferior cerebellar arteries (AICA): Patent origins from the basilar artery. --Basilar artery: Normal. --Superior cerebellar arteries: Normal. --Posterior cerebral arteries: Normal. Both originate from the basilar artery. Posterior communicating arteries (p-comm) are diminutive or absent. ANTERIOR CIRCULATION: --Intracranial internal carotid arteries: Atherosclerotic calcification of the internal carotid arteries at the skull base without hemodynamically significant stenosis. --Anterior cerebral arteries (ACA): Normal. Both A1 segments are present. Patent anterior communicating artery (a-comm). --Middle cerebral arteries (MCA): There is moderate stenosis of the distal right MCA M1 segment. There is no proximal occlusion. There is mild narrowing of the distal left M1 segment. The M2 branches are patent. There is tapering of an M3 branch in the area of the frontal operculum infarct. VENOUS SINUSES: As permitted by contrast timing, patent. ANATOMIC VARIANTS: None Review of the MIP images  confirms the above findings. CT Brain Perfusion Findings: ASPECTS: 8 CBF (<30%) Volume: 11mL Perfusion (Tmax>6.0s) volume: 36mL Mismatch Volume: 25mL Infarction Location:Left frontal operculum IMPRESSION: 1. No emergent large vessel occlusion. 2. 11 mL core infarct in the left frontal operculum with surrounding 25 mL ischemic penumbra. 3. Mild narrowing of the distal left M1 segment with tapering of an M3 branch in the area of the frontal operculum infarct. 4. 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 5. Mild bilateral carotid bifurcation atherosclerosis without hemodynamically significant stenosis. 6. Moderate stenosis of the distal right MCA M1 segment. These results were called by telephone at the time of interpretation on 12/19/2019 at 12:30 am to provider ERIC [facility] , who verbally acknowledged these results. Electronically Signed   By: Deatra Robinson M.D.   On: 12/19/2019 00:35   MR BRAIN WO CONTRAST  Result Date: 12/19/2019 CLINICAL DATA:  Acute onset of aphasia. EXAM: MRI HEAD WITHOUT CONTRAST TECHNIQUE: Multiplanar, multiecho pulse sequences of the brain and surrounding structures were obtained without intravenous contrast. COMPARISON:  Head CT 12/19/2019 FINDINGS: BRAIN: Intermediate sized acute infarct of the left frontal operculum. Early confluent hyperintense T2-weighted signal of the periventricular and deep white matter, most commonly due to chronic ischemic microangiopathy. 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 The cerebral and cerebellar volume are age-appropriate. There is no hydrocephalus. The midline structures are normal. VASCULAR: The major intracranial arterial and venous sinus flow voids are normal. Hemosiderin deposition at the site of old left parietal infarct. No acute hemorrhage. SKULL AND UPPER CERVICAL SPINE: Calvarial bone marrow signal is normal. There is no skull base mass. The visualized upper cervical spine and soft tissues are  normal. SINUSES/ORBITS: Mild left maxillary sinus mucosal thickening. The orbits are normal. IMPRESSION: 1. Intermediate sized acute infarct of the left frontal operculum. No hemorrhage or mass effect. 2. 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 3. Moderate chronic small vessel disease. Electronically Signed   By: Deatra Robinson M.D.   On: 12/19/2019 02:49   CT Code Stroke Cerebral Perfusion with contrast  Result Date: 12/19/2019 CLINICAL DATA:  Aphasia EXAM: CT ANGIOGRAPHY HEAD AND NECK CT PERFUSION BRAIN TECHNIQUE: Multidetector CT imaging of the head and neck was performed using the standard protocol during bolus administration of intravenous contrast. Multiplanar CT image reconstructions and MIPs were obtained to evaluate the vascular anatomy. Carotid stenosis measurements (when applicable) are obtained utilizing NASCET criteria, using the distal internal carotid diameter as the denominator. Multiphase CT imaging of the brain was performed following IV bolus contrast injection. Subsequent parametric perfusion maps were calculated using RAPID software. CONTRAST:  OMNIPAQUE IOHEXOL 350 MG/ML SOLN COMPARISON:  None. FINDINGS: CTA NECK FINDINGS SKELETON: There is no bony spinal canal stenosis. No lytic or blastic lesion. OTHER NECK: Normal pharynx, larynx and major salivary glands. No cervical lymphadenopathy. Unremarkable thyroid gland. UPPER CHEST: No pneumothorax or pleural effusion. No nodules or masses. AORTIC ARCH: There is no calcific atherosclerosis of the aortic arch. There is no aneurysm, dissection or hemodynamically significant stenosis of the visualized portion of the aorta. Conventional 3 vessel aortic branching pattern. The visualized proximal subclavian arteries are widely patent. RIGHT CAROTID SYSTEM: No dissection, occlusion or aneurysm. Mild atherosclerotic calcification at the carotid bifurcation without hemodynamically significant stenosis. LEFT CAROTID SYSTEM: No  dissection, occlusion or aneurysm. Mild atherosclerotic calcification at the carotid bifurcation without hemodynamically significant stenosis. VERTEBRAL ARTERIES: Left dominant configuration. Moderate calcification of the left vertebral artery origin. There is no dissection, occlusion or flow-limiting stenosis to the skull base (V1-V3 segments). CTA HEAD FINDINGS POSTERIOR CIRCULATION: --Vertebral arteries: Bilateral atherosclerotic calcification of the V4 segments with severe stenosis on the right. No stenosis on the left. --Posterior inferior cerebellar arteries (PICA): Patent origins from the vertebral arteries. --Anterior inferior cerebellar arteries (AICA): Patent origins from the basilar artery. --Basilar artery: Normal. --Superior cerebellar arteries: Normal. --Posterior cerebral arteries: Normal. Both originate from the basilar artery. Posterior communicating arteries (p-comm) are diminutive or absent. ANTERIOR CIRCULATION: --Intracranial internal carotid arteries: Atherosclerotic calcification of the internal carotid arteries at the skull base without hemodynamically significant stenosis. --Anterior cerebral arteries (ACA): Normal. Both A1 segments are present. Patent anterior communicating artery (a-comm). --Middle cerebral arteries (MCA): There is moderate stenosis of the distal right MCA M1 segment. There is no proximal occlusion. There is mild narrowing of the distal left M1 segment. The M2 branches are patent. There is tapering of an M3 branch in the area of the frontal operculum infarct. VENOUS SINUSES: As permitted by contrast timing, patent. ANATOMIC VARIANTS: None Review of the MIP images confirms the above findings. CT Brain Perfusion Findings: ASPECTS: 8 CBF (<30%) Volume: 11mL Perfusion (Tmax>6.0s) volume: 36mL Mismatch Volume: 25mL Infarction Location:Left frontal operculum IMPRESSION: 1. No emergent large vessel occlusion. 2. 11 mL core infarct in the left frontal operculum with surrounding 25  mL ischemic penumbra. 3. Mild narrowing of the distal left M1 segment with tapering of an M3 branch in the area of the frontal operculum infarct. 4. 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 5. Mild bilateral carotid bifurcation atherosclerosis without hemodynamically significant stenosis. 6. Moderate stenosis of the distal right MCA M1 segment. These results were called by telephone at the time of interpretation on 12/19/2019 at 12:30 am to provider ERIC [facility] , who verbally acknowledged these results. Electronically Signed   By: Chrisandra Netters.D.  On: 12/19/2019 00:35   ECHOCARDIOGRAM COMPLETE  Result Date: 12/19/2019   ECHOCARDIOGRAM REPORT   Patient Name:   CHAUNCE WINKELS Date of Exam: 12/19/2019 Medical Rec #:  [id number]    Height:       71.0 in Accession #:    [id number]   Weight:       215.6 lb Date of Birth:  November 02, 1941    BSA:          2.18 m Patient Age:    78 years     BP:           118/64 mmHg Patient Gender: M            HR:           37 bpm. Exam Location:  Inpatient Procedure: 2D Echo, Cardiac Doppler, Color Doppler and Intracardiac            Opacification Agent Indications:    Stroke  History:        Patient has no prior history of Echocardiogram examinations. CAD                 and Previous Myocardial Infarction, Prior CABG and Abnormal ECG,                 Stroke and COPD; Risk Factors:Hypertension.  Sonographer:    Sheralyn Boatman RDCS Referring Phys: [phone] JARED M GARDNER  Sonographer Comments: Technically difficult study due to poor echo windows, no subcostal window, suboptimal subcostal window and suboptimal apical window. IMPRESSIONS  1. Left ventricular ejection fraction, by visual estimation, is 20 to 25%. The left ventricle has severely decreased function. There is moderately increased left ventricular hypertrophy.  2. Definity contrast agent was given IV to delineate the left ventricular endocardial borders. No mural thrombus was noted.  3. Left ventricular  diastolic function could not be evaluated.  4. The left ventricle demonstrates global hypokinesis.  5. Global right ventricle has normal systolic function.The right ventricular size is normal. No increase in right ventricular wall thickness.  6. Left atrial size was moderately dilated.  7. Right atrial size was mildly dilated.  8. The mitral valve is grossly normal. Trivial mitral valve regurgitation.  9. The tricuspid valve is grossly normal. 10. The aortic valve is tricuspid. Aortic valve regurgitation is trivial. Mild aortic valve sclerosis without stenosis. 11. The pulmonic valve was not well visualized. Pulmonic valve regurgitation is not visualized. 12. Aortic dilatation noted. 13. 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 FINDINGS  Left Ventricle: Left ventricular ejection fraction, by visual estimation, is 20 to 25%. The left ventricle has severely decreased function. Definity contrast agent was given IV to delineate the left ventricular endocardial borders. The left ventricle demonstrates global hypokinesis. There is moderately increased left ventricular hypertrophy. The left ventricular diastology could not be evaluated due to indeterminate diastolic function. Left ventricular diastolic function could not be evaluated. Right Ventricle: The right ventricular size is normal. No increase in right ventricular wall thickness. Global RV systolic function is has normal systolic function. Left Atrium: Left atrial size was moderately dilated. Right Atrium: Right atrial size was mildly dilated Pericardium: There is no evidence of pericardial effusion. Mitral Valve: The mitral valve is grossly normal. There is mild thickening of the mitral valve leaflet(s). Trivial mitral valve regurgitation. Tricuspid Valve: The tricuspid valve is grossly normal. Tricuspid valve regurgitation is trivial. Aortic Valve: The aortic valve is tricuspid. Aortic valve regurgitation is trivial. Mild aortic valve  sclerosis is present, with no evidence of aortic valve stenosis. Aortic valve mean gradient measures 7.5 mmHg. Aortic valve peak gradient measures 15.4 mmHg. Aortic valve area, by VTI measures 4.38 cm. Pulmonic Valve: The pulmonic valve was not well visualized. Pulmonic valve regurgitation is not visualized. Pulmonic regurgitation is not visualized. Aorta: Aortic dilatation noted. 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 Venous: The inferior vena cava was not well visualized. IAS/Shunts: No atrial level shunt detected by color flow Doppler.  LEFT VENTRICLE PLAX 2D LVIDd:         6.60 cm LVIDs:         5.90 cm LV PW:         1.50 cm LV IVS:        1.60 cm LVOT diam:     2.70 cm LV SV:         50 ml LV SV Index:   22.53 LVOT Area:     5.73 cm  LV Volumes (MOD) LV area d, A4C:    52.90 cm LV area s, A4C:    44.30 cm LV major d, A4C:   9.50 cm LV major s, A4C:   8.62 cm LV vol d, MOD A4C: 226.0 ml LV vol s, MOD A4C: 175.0 ml LV SV MOD A4C:     226.0 ml RIGHT VENTRICLE         IVC TAPSE (M-mode): 1.7 cm  IVC diam: 2.70 cm LEFT ATRIUM              Index       RIGHT ATRIUM           Index LA diam:        4.10 cm  1.88 cm/m  RA Area:     23.60 cm LA Vol (A2C):   104.0 ml 47.77 ml/m RA Volume:   77.40 ml  35.55 ml/m LA Vol (A4C):   71.6 ml  32.89 ml/m LA Biplane Vol: 90.4 ml  41.53 ml/m  AORTIC VALVE AV Area (Vmax):    4.27 cm AV Area (Vmean):   4.53 cm AV Area (VTI):     4.38 cm AV Vmax:           196.50 cm/s AV Vmean:          125.000 cm/s AV VTI:            0.412 m AV Peak Grad:      15.4 mmHg AV Mean Grad:      7.5 mmHg LVOT Vmax:         146.50 cm/s LVOT Vmean:        98.950 cm/s LVOT VTI:          0.316 m LVOT/AV VTI ratio: 0.77  AORTA Ao Root diam: 3.60 cm Ao Asc diam:  4.00 cm MITRAL VALVE MV Area (PHT): 4.06 cm             SHUNTS MV PHT:        54.13 msec           Systemic VTI:  0.32 m MV Decel Time: 187 msec             Systemic Diam: 2.70 cm MV E velocity: 85.87 cm/s 103 cm/s  MV A velocity: 83.90 cm/s 70.3 cm/s MV E/A ratio:  1.02       1.5  Zoila Shutter MD Electronically signed by Zoila Shutter MD Signature Date/Time: 12/19/2019/12:00:27 PM    Final  CT HEAD CODE STROKE WO CONTRAST  Result Date: 12/19/2019 CLINICAL DATA:  Code stroke.  Confusion and aphasia EXAM: CT HEAD WITHOUT CONTRAST TECHNIQUE: Contiguous axial images were obtained from the base of the skull through the vertex without intravenous contrast. COMPARISON:  None. FINDINGS: Brain: There is no mass, hemorrhage or extra-axial collection. The size and configuration of the ventricles and extra-axial CSF spaces are normal. There is hypoattenuation of the periventricular white matter, most commonly indicating chronic ischemic microangiopathy. There is an area of acute or early subacute ischemia within the anterior left MCA distribution, involving the left frontal operculum. 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 Vascular: No abnormal hyperdensity of the major intracranial arteries or dural venous sinuses. No intracranial atherosclerosis. Skull: The visualized skull base, calvarium and extracranial soft tissues are normal. Sinuses/Orbits: Moderate left maxillary sinus mucosal thickening. The orbits are normal. ASPECTS [facility]([place] Stroke Program Early CT Score) - Ganglionic level infarction (caudate, lentiform nuclei, internal capsule, insula, M1-M3 cortex): 6 - Supraganglionic infarction (M4-M6 cortex): 2 Total score (0-10 with 10 being normal): 8 IMPRESSION: 1. 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 2. No hemorrhage. 3. 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 4. ASPECTS is 8. 5. These results were communicated to Dr. Caryl PinaEric Lindzen at 12:10 am on 12/19/2019 by text page via the [facility] system. Electronically Signed   By: Deatra RobinsonKevin  Herman M.D.   On: 12/19/2019 00:12    PHYSICAL EXAM  Temp:  [97.7 F (36.5  C)-98.2 F (36.8 C)] 97.7 F (36.5 C) (12/28 0735) Pulse Rate:  [29-73] 57 (12/28 1157) Resp:  [11-21] 20 (12/28 1157) BP: (108-136)/(58-87) 122/65 (12/28 1157) SpO2:  [94 %-98 %] 97 % (12/28 1157) Weight:  [97.8 kg-99.2 kg] 97.8 kg (12/28 0359)  General - Well nourished, well developed, in no apparent distress.  Ophthalmologic - fundi not visualized due to noncooperation.  Cardiovascular - frequent PACs and PVCs on tele.  Neuro - awake alert, eyes open, attending to both sides, no gaze deviation, PERRL. Expressive aphasia, nonverbal, however, able to follow central commands and most of the peripheral commands, but did not "wiggle toes""show two fingers" on commands. Facial symmetrical, blinking to visual threat bilaterally, tongue midline. Moving all extremities equally and against gravity. Right index finger amputated but chronic. DTR 1+ and no babinski. Sensation, coordination and gait not tested.    ASSESSMENT/PLAN Mr. Raymond BurdockRichard Vance is a 78 y.o. male with history of HTN, MI, CABG, COPD, cataract surgery 2 weeks ago who developed acute onset aphasia, mute on arrival. No tPA given due to being outside the window.   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 mult old infarcts. ASPECTS 8.  CTA head & neck no LVO. Mild narrowing distal L M2 w/ tapering L M3. Severe stenosis R V4. Moderate stenosis R M1.  Bilateral siphon stenosis right more than left.  Bilateral VA origin stenosis.  CT perfusion 11 mL L frontal operculum core infarct w/ 25 mL penumbra  MRI  L frontal operculum infarct. Old R cerebellar and L parietal lobe infarcts. Moderate small vessel disease.  2D Echo  EF 20-25%. Moderate LVH. R & L atria dilatation. No source of embolus   LDL 112  HgbA1c 6.2  Lovenox 40 mg sq daily for VTE prophylaxis  No antithrombotic prior to admission, now on aspirin 325 mg  daily followed  by 600 mg load. Recommend anticoagulation in 3 days given moderate sized infarct  Therapy recommendations:  CIR  Disposition:  pending   Cardiomyopathy  EF 20 to 25%  Likely the cause of stroke  On aspirin  Will recommend anticoagulation in 3 days given moderate size infarct  Hypertension . Stable . Permissive hypertension (OK if <220/120) for 24-48 hours post stroke and then gradually normalized within 3-5 days.  Long term BP goal normotensive  Hyperlipidemia  Home meds:  No statin   LDL 112, goal < 70  On Lipitor 40 now  Continue statin at discharge  Other Stroke Risk Factors  Advanced age  Probable Cigarette smoker, smoking cessation will be provided once appropriate  Obesity, Body mass index is 30.07 kg/m.   CAD/MI status post CABG  Other Active Problems  Cataract surgery 2 weeks ago  Hospital day # 0   Rosalin Hawking, MD PhD Stroke Neurology 12/19/2019 12:16 PM    To contact Stroke Continuity provider, please refer to [email or web address] After hours, contact General Neurology

## 2019-12-19 NOTE — Evaluation (Signed)
Clinical/Bedside Swallow Evaluation Patient Details  Name: Raymond Vance MRN: [id number] Date of Birth: 16-Apr-1941  Today's Date: 12/19/2019 Time: SLP Start Time (ACUTE ONLY): 1753 SLP Stop Time (ACUTE ONLY): 1822 SLP Time Calculation (min) (ACUTE ONLY): 29 min  Past Medical History:  Past Medical History:  Diagnosis Date  . COPD (chronic obstructive pulmonary disease) (HCC)   . Hypertension   . STEMI (ST elevation myocardial infarction) [facility])    Past Surgical History:  Past Surgical History:  Procedure Laterality Date  . CATARACT EXTRACTION    . CORONARY ARTERY BYPASS GRAFT     HPI: Raymond Vance is a 78 y.o. male with medical history significant of HTN, CAD s/p CABG.  Patient presents to ED via EMS with acute onset of aphasia. MRI showed acute CVA of the frontal operculum as well as past L parietal and R cerebellum.        Assessment / Plan / Recommendation Clinical Impression   Patient seen at bedside for clinical swallowing evaluation. Pt in bed, HOB raised. Patient with no verbalizations, utilizing head nod/shake to answer yes/no questions. Patient with both upper and lower dentures.   Pt seen with self administered soft solids (chopped peaches), no overt s/s aspiration or difficulty noted, good oral clearance achieved. Pt self fed Bologna sandwich, taking bites in rapid succession, some pocketing in R buccal area, overfilling oral cavity. Pt able to follow cues to slow rate of intake and use liquid rinse. Pt with one instance of significant coughing following thin liquid rinse, unclear if d/t material from sandwich or thin liquids. Pt seen with self administered successive cup sips of thin liquids, no coughing, throat clearing or difficulty observed. Recommend dysphagia 3 and thin liquids with strategies of slowing rate of intake, taking small bites/sips, checking oral cavity for pocketing.  SLP Visit Diagnosis: Dysphagia, unspecified (R13.10)    Aspiration Risk       Diet  Recommendation Dysphagia 3 (Mech soft);Thin liquid   Liquid Administration via: No straw;Cup Medication Administration: Whole meds with liquid Supervision: Intermittent supervision to cue for compensatory strategies(set up assist) Compensations: Slow rate;Minimize environmental distractions;Small sips/bites;Lingual sweep for clearance of pocketing Postural Changes: Seated upright at 90 degrees    Other  Recommendations Oral Care Recommendations: Oral care QID   Follow up Recommendations (tbd)      Frequency and Duration min 2x/week  2 weeks       Prognosis Prognosis for Safe Diet Advancement: Good      Swallow Study   General Type of Study: Bedside Swallow Evaluation Diet Prior to this Study: Information not available Temperature Spikes Noted: No Behavior/Cognition: Cooperative;Pleasant mood;Impulsive Oral Cavity Assessment: Within Functional Limits Oral Cavity - Dentition: Dentures, bottom;Dentures, top Vision: Functional for self-feeding Self-Feeding Abilities: Able to feed self(some impulsivity) Patient Positioning: Upright in bed Baseline Vocal Quality: (difficult to assess d/t aphasia) Volitional Cough: (unable to elicit) Volitional Swallow: Able to elicit    Oral/Motor/Sensory Function Overall Oral Motor/Sensory Function: Mild impairment Facial ROM: Within Functional Limits Facial Symmetry: Within Functional Limits Facial Strength: Within Functional Limits Facial Sensation: Reduced right Lingual ROM: Within Functional Limits Lingual Symmetry: Within Functional Limits Lingual Strength: Within Functional Limits Mandible: Within Functional Limits   Ice Chips Ice chips: Within functional limits   Thin Liquid Thin Liquid: Within functional limits Presentation: Cup;Self Fed;Spoon    Nectar Thick Nectar Thick Liquid: Not tested   Honey Thick Honey Thick Liquid: Not tested   Puree Puree: Not tested   Solid  Solid: Impaired Presentation: Self Fed;Spoon Oral  Phase Impairments: Poor awareness of bolus;Reduced lingual movement/coordination Oral Phase Functional Implications: Right lateral sulci pocketing;Oral residue Pharyngeal Phase Impairments: Cough - Immediate      Raymond Vance, M.Ed., CCC-SLP Speech Therapy Acute Rehabilitation 12/19/2019,6:57 PM

## 2019-12-19 NOTE — Evaluation (Signed)
Physical Therapy Evaluation Patient Details Name: Raymond Vance MRN: [id number] DOB: 08-19-1941 Today's Date: 12/19/2019   History of Present Illness   Raymond Vance is a 78 y.o. male with medical history significant of HTN, CAD s/p CABG.  Patient presents to ED via EMS with acute onset of aphasia. MRI showed acute CVA of the frontal operculum as well as past L parietal and R cerebellum.   Clinical Impression  Pt admitted with above diagnosis. Pt presents with expressive aphasia, seems to understand a significant amount of conversation and followed commands >75% of time. Min A for transfers and ambulation with RW but ambulation limited by HR instability, up to 177 bpm and pt with labored breathing. Pt returned to supine in bed.  Pt currently with functional limitations due to the deficits listed below (see PT Problem List). Pt will benefit from skilled PT to increase their independence and safety with mobility to allow discharge to the venue listed below.       Follow Up Recommendations CIR    Equipment Recommendations  None recommended by PT    Recommendations for Other Services Rehab consult;Speech consult     Precautions / Restrictions Precautions Precautions: Fall Precaution Comments: watch HR  Restrictions Weight Bearing Restrictions: No      Mobility  Bed Mobility Overal bed mobility: Needs Assistance Bed Mobility: Supine to Sit     Supine to sit: Supervision     General bed mobility comments: increased time needed and took obvious effort for pt. HR increased to mid 80's with sitting up.   Transfers Overall transfer level: Needs assistance Equipment used: Rolling walker (2 wheeled) Transfers: Sit to/from Stand Sit to Stand: Min assist         General transfer comment: minA for safety  Ambulation/Gait Ambulation/Gait assistance: Min assist Gait Distance (Feet): 30 Feet Assistive device: Rolling walker (2 wheeled) Gait Pattern/deviations: Step-through  pattern;Decreased stride length Gait velocity: decreased Gait velocity interpretation: <1.8 ft/sec, indicate of risk for recurrent falls General Gait Details: pt began seemingly steady with RW but after 15' was less responsive and too close to objects on R side. Increased WOB noted, HR increased to 120's and highest seen was 170's as he got back in bed. Ambulation terminated due to instability of HR  Stairs            Wheelchair Mobility    Modified Rankin (Stroke Patients Only) Modified Rankin (Stroke Patients Only) Pre-Morbid Rankin Score: No symptoms Modified Rankin: Moderately severe disability     Balance Overall balance assessment: Needs assistance Sitting-balance support: No upper extremity supported;Feet supported Sitting balance-Leahy Scale: Fair     Standing balance support: Bilateral upper extremity supported Standing balance-Leahy Scale: Fair Standing balance comment: BUE support on RW                             Pertinent Vitals/Pain Pain Assessment: Faces Faces Pain Scale: No hurt Pain Intervention(s): Monitored during session    Home Living Family/patient expects to be discharged to:: Private residence Living Arrangements: Spouse/significant other               Additional Comments: pt with expressive difficulties, unable to provide home setup, did nod head yes when asked if he lived with his wife    Prior Function Level of Independence: Independent         Comments: pt nodded head yes when asked if he was independent at baseline, unsure if pt  is a reliable historian     Hand Dominance        Extremity/Trunk Assessment   Upper Extremity Assessment Upper Extremity Assessment: Defer to OT evaluation    Lower Extremity Assessment Lower Extremity Assessment: Generalized weakness    Cervical / Trunk Assessment Cervical / Trunk Assessment: Normal  Communication   Communication: Expressive difficulties  Cognition  Arousal/Alertness: Awake/alert Behavior During Therapy: WFL for tasks assessed/performed Overall Cognitive Status: Difficult to assess                                 General Comments: pt nodding head yes and no, unsure accuracy of answers      General Comments General comments (skin integrity, edema, etc.): wheezing noted when pt returned to supine, HOB elevated    Exercises     Assessment/Plan    PT Assessment Patient needs continued PT services  PT Problem List Decreased strength;Decreased activity tolerance;Cardiopulmonary status limiting activity;Decreased mobility;Decreased balance       PT Treatment Interventions DME instruction;Gait training;Stair training;Functional mobility training;Therapeutic activities;Therapeutic exercise;Balance training;Neuromuscular re-education;Cognitive remediation;Patient/family education    PT Goals (Current goals can be found in the Care Plan section)  Acute Rehab PT Goals Patient Stated Goal: unable to state PT Goal Formulation: Patient unable to participate in goal setting Time For Goal Achievement: 01/02/20 Potential to Achieve Goals: Good    Frequency Min 4X/week   Barriers to discharge        Vance-evaluation PT/OT/SLP Vance-Evaluation/Treatment: Yes Reason for Vance-Treatment: Complexity of the patient's impairments (multi-system involvement);For patient/therapist safety PT goals addressed during session: Mobility/safety with mobility;Balance;Proper use of DME         AM-PAC PT "6 Clicks" Mobility  Outcome Measure Help needed turning from your back to your side while in a flat bed without using bedrails?: A Little Help needed moving from lying on your back to sitting on the side of a flat bed without using bedrails?: A Little Help needed moving to and from a bed to a chair (including a wheelchair)?: A Little Help needed standing up from a chair using your arms (e.g., wheelchair or bedside chair)?: A Little Help needed  to walk in hospital room?: A Lot Help needed climbing 3-5 steps with a railing? : Total 6 Click Score: 15    End of Session Equipment Utilized During Treatment: Gait belt Activity Tolerance: Treatment limited secondary to medical complications (Comment)(HR instability) Patient left: in bed;with call bell/phone within reach;with bed alarm set Nurse Communication: Mobility status;Other (comment)(HR) PT Visit Diagnosis: Unsteadiness on feet (R26.81);Difficulty in walking, not elsewhere classified (R26.2)    Time: 3419-6222 PT Time Calculation (min) (ACUTE ONLY): 22 min   Charges:   PT Evaluation $PT Eval Moderate Complexity: 1 Mod          Raymond Vance, PT  Acute Rehab Services  Pager [phone] Office [phone]   Raymond Vance 12/19/2019, 2:11 PM

## 2019-12-19 NOTE — Progress Notes (Signed)
Received pt from the ED at 0400, alert but nonverbal, able to some commands, passed swallow in the ED but a per MD he needs speech to do the swallow, placed on tele and implemented MD orders, Q2 continues

## 2019-12-20 DIAGNOSIS — I639 Cerebral infarction, unspecified: Secondary | ICD-10-CM

## 2019-12-20 DIAGNOSIS — E7800 Pure hypercholesterolemia, unspecified: Secondary | ICD-10-CM

## 2019-12-20 DIAGNOSIS — I493 Ventricular premature depolarization: Secondary | ICD-10-CM

## 2019-12-20 DIAGNOSIS — I491 Atrial premature depolarization: Secondary | ICD-10-CM

## 2019-12-20 DIAGNOSIS — I63412 Cerebral infarction due to embolism of left middle cerebral artery: Secondary | ICD-10-CM

## 2019-12-20 MED ORDER — METOPROLOL TARTRATE 25 MG PO TABS
25.0000 mg | ORAL_TABLET | Freq: Two times a day (BID) | ORAL | Status: DC
  Administered 2019-12-20 – 2019-12-21 (×3): 25 mg via ORAL
  Filled 2019-12-20 (×3): qty 1

## 2019-12-20 MED ORDER — MOMETASONE FURO-FORMOTEROL FUM 200-5 MCG/ACT IN AERO
2.0000 | INHALATION_SPRAY | Freq: Two times a day (BID) | RESPIRATORY_TRACT | Status: DC
  Administered 2019-12-20 – 2019-12-21 (×2): 2 via RESPIRATORY_TRACT
  Filled 2019-12-20: qty 8.8

## 2019-12-20 MED ORDER — ALBUTEROL SULFATE (2.5 MG/3ML) 0.083% IN NEBU: 2.5000 mg | INHALATION_SOLUTION | Freq: Four times a day (QID) | RESPIRATORY_TRACT | Status: DC | PRN

## 2019-12-20 NOTE — Plan of Care (Signed)
Patient consumed about 50% of breakfast meal today.

## 2019-12-20 NOTE — Progress Notes (Signed)
Inpatient Rehabilitation Admissions Coordinator  Inpatient rehab consult received. I met with patient at bedside for assessment.I then contacted his wife by phone to discuss goals and expectations of an inpt rehab admit.. She has not visited since Sunday, for EMS told her she would not be allowed in the hospital . I clarified with her that visiting hours are 10 am until 8 pm daily with COVID restrictions for masking and check in to visit. She will have her grandson bring her to visit so that she can determine if she would like to pursue CIR admit or direct d/c home which is her preference. I will begin insurance authorization with [facility] in case they would like to pursue CIR admit when medical work up is complete.  Danne Baxter, RN, MSN Rehab Admissions Coordinator [phone] 12/20/2019 2:09 PM

## 2019-12-20 NOTE — Consult Note (Addendum)
Cardiology Consultation:   Patient ID: Raymond Vance MRN: [id number]; DOB: 1941-04-27  Admit date: 12/18/2019 Date of Consult: 12/20/2019  Primary Care Provider: Wilburn Mylar, MD Primary Cardiologist: Parke Poisson, MD  Primary Electrophysiologist:  None    Patient Profile:   Raymond Vance is a 78 y.o. male with a hx of CAD s/p CABG (remote), former tobacco use, HTN, HLD, chronic systolic heart failure, COPD,  LBBB (per chart review), who is being seen today for the evaluation of heart failure at the request of Dr. Caleb Popp.  History of Present Illness:   Raymond Vance has not been seen by [facility] before and in chart review it appears he has not seen a cardiologist in many years. He reports a history of CAD s/p 2V CABG in 2006. In 2018 EF was 30-35%. He has seen pulmonology in the past for COPD. He has been taking Aspirin 81 mg daily, Lipitor 40 mg daily, HCTZ 25 mg daily, lisinopril 40 mg daily, ?spironolactone 25 mg, and Lopressor 50 mg daily. He denies history of exertional chest pain or SOB. No lower leg edema or orthopnea.  The patient presented to the ED  12/19/19 as a code stroke. The patient started exhibiting signs of confusion and that evening was unable to speak. EMS was called and recorded he was able to move all his extremities. On arrival to the ED he was mute. CT head showed early subacute ischemia of the anterior left MCA involving the left frontal operculum. No hemorrhage or LVO. TPA was not given because he was outside the time window.   In the ED BP 125/81, afebrile, pulse 67, RR 20, 95% O2. 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 Labs showed potassium 4.5, glucose 103, creatinine 1.24, BUN 28. LFTs wnl. WBC 7.5, Hgb 16.9. The patient was admitted and Neurology was consulted.  Heart Pathway Score:     Past Medical History:  Diagnosis Date  . COPD (chronic obstructive pulmonary disease) (HCC)   . Hypertension   . STEMI (ST elevation  myocardial infarction) [facility])     Past Surgical History:  Procedure Laterality Date  . CATARACT EXTRACTION    . CORONARY ARTERY BYPASS GRAFT       Home Medications:  Prior to Admission medications   Medication Sig Start Date End Date Taking? Authorizing Provider  albuterol (VENTOLIN HFA) 108 (90 Base) MCG/ACT inhaler Inhale 2 puffs into the lungs every 6 (six) hours as needed for wheezing or shortness of breath. 09/09/19  Yes [provider]  aspirin 81 MG EC tablet Take 81 mg by mouth daily. 05/05/15  Yes [provider]  atorvastatin (LIPITOR) 40 MG tablet Take 40 mg by mouth daily. 12/14/19  Yes [provider]  budesonide-formoterol (SYMBICORT) 160-4.5 MCG/ACT inhaler Inhale 2 puffs into the lungs 2 (two) times daily. 09/30/19  Yes [provider]  hydrochlorothiazide (HYDRODIURIL) 25 MG tablet Take 25 mg by mouth daily. 02/27/17  Yes [provider]  lisinopril (ZESTRIL) 40 MG tablet Take 40 mg by mouth daily. 02/27/17  Yes [provider]  metoprolol tartrate (LOPRESSOR) 50 MG tablet Take 50 mg by mouth daily. 02/27/17  Yes [provider]  omeprazole (PRILOSEC) 40 MG capsule Take 40 mg by mouth daily. 02/27/17  Yes [provider]  spironolactone (ALDACTONE) 25 MG tablet Take 25 mg by mouth daily. 02/27/17  Yes [provider]    Inpatient Medications: Scheduled Meds: . aspirin EC  325 mg Oral  Daily  . atorvastatin  40 mg Oral q1800  . chlorhexidine  15 mL Mouth Rinse BID  . enoxaparin (LOVENOX) injection  40 mg Subcutaneous Q24H  . mouth rinse  15 mL Mouth Rinse BID  . metoprolol tartrate  25 mg Oral BID  . mometasone-formoterol  2 puff Inhalation BID   Continuous Infusions: . sodium chloride 50 mL/hr at 12/20/19 1017   PRN Meds: acetaminophen **OR** acetaminophen (TYLENOL) oral liquid 160 mg/5 mL **OR** acetaminophen, albuterol  Allergies:    Allergies  Allergen Reactions  . Pneumococcal Vaccine      Other reaction(s): Cough (ALLERGY/intolerance), Other (See Comments)    Social History:   Social History   Socioeconomic History  . Marital status: Married    Spouse name: Not on file  . Number of children: Not on file  . Years of education: Not on file  . Highest education level: Not on file  Occupational History  . Not on file  Tobacco Use  . Smoking status: Not on file  Substance and Sexual Activity  . Alcohol use: Not on file  . Drug use: Not on file  . Sexual activity: Not on file  Other Topics Concern  . Not on file  Social History Narrative  . Not on file   Social Determinants of Health   Financial Resource Strain:   . Difficulty of Paying Living Expenses: Not on file  Food Insecurity:   . Worried About Programme researcher, broadcasting/film/video in the Last Year: Not on file  . Ran Out of Food in the Last Year: Not on file  Transportation Needs:   . Lack of Transportation (Medical): Not on file  . Lack of Transportation (Non-Medical): Not on file  Physical Activity:   . Days of Exercise per Week: Not on file  . Minutes of Exercise per Session: Not on file  Stress:   . Feeling of Stress : Not on file  Social Connections:   . Frequency of Communication with Friends and Family: Not on file  . Frequency of Social Gatherings with Friends and Family: Not on file  . Attends Religious Services: Not on file  . Active Member of Clubs or Organizations: Not on file  . Attends Banker Meetings: Not on file  . Marital Status: Not on file  Intimate Partner Violence:   . Fear of Current or Ex-Partner: Not on file  . Emotionally Abused: Not on file  . Physically Abused: Not on file  . Sexually Abused: Not on file    Family History:   Family History  Family history unknown: Yes     ROS:  Please see the history of present illness.  All other ROS reviewed and negative.     Physical Exam/Data:   Vitals:   12/20/19 0000 12/20/19 0358 12/20/19 0844 12/20/19 1211  BP: 121/76  (!) 141/78    Pulse: 61 64    Resp: 20 18    Temp: 98.5 F (36.9 C) 98.5 F (36.9 C) 98.1 F (36.7 C) 97.8 F (36.6 C)  TempSrc: Oral Oral Oral Oral  SpO2: 100% 98%    Weight:      Height:        Intake/Output Summary (Last 24 hours) at 12/20/2019 1358 Last data filed at 12/20/2019 0843 Gross per 24 hour  Intake 872.31 ml  Output 825 ml  Net 47.31 ml   Last 3 Weights 12/19/2019 12/19/2019  Weight (lbs) 215 lb 9.8 oz 218 lb 11.1 oz  Weight (kg) 97.8 kg 99.2 kg     Body mass index is 30.07 kg/m.  General:  Well nourished, well developed, in no acute distress HEENT: normal Lymph: no adenopathy Neck: no JVD Endocrine:  No thryomegaly Vascular: No carotid bruits; FA pulses 2+ bilaterally without bruits  Cardiac:  normal S1, S2; RRR; no murmur  Lungs:  + wheezing, +rhonchi  Abd: soft, nontender, no hepatomegaly  Ext: no edema Musculoskeletal:  No deformities, BUE and BLE strength normal and equal Skin: warm and dry  Neuro: Aphasia; CNs 2-12 intact, no focal abnormalities noted Psych:  Normal affect   EKG:  The EKG was personally reviewed and demonstrates:  NSR with LBBB (old), 55 bpm, LVH TWI V5-V6, St depression V2, V3, LAD Telemetry:  Telemetry was personally reviewed and demonstrates:  NSR, HR 60-70, 2 beats NSVT, vent bigeminy, 1st degree AV block  Relevant CV Studies:  Echo 12/19/19 Sonographer Comments: Technically difficult study due to poor echo windows, no subcostal window, suboptimal subcostal window and suboptimal apical window. IMPRESSIONS   1. Left ventricular ejection fraction, by visual estimation, is 20 to 25%. The left ventricle has severely decreased function. There is moderately increased left ventricular hypertrophy.  2. Definity contrast agent was given IV to delineate the left ventricular endocardial borders. No mural thrombus was noted.  3. Left ventricular diastolic function could not be evaluated.  4. The left ventricle demonstrates global  hypokinesis.  5. Global right ventricle has normal systolic function.The right ventricular size is normal. No increase in right ventricular wall thickness.  6. Left atrial size was moderately dilated.  7. Right atrial size was mildly dilated.  8. The mitral valve is grossly normal. Trivial mitral valve regurgitation.  9. The tricuspid valve is grossly normal. 10. The aortic valve is tricuspid. Aortic valve regurgitation is trivial. Mild aortic valve sclerosis without stenosis. 11. The pulmonic valve was not well visualized. Pulmonic valve regurgitation is not visualized. 12. Aortic dilatation noted. 13. 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  Laboratory Data:  High Sensitivity Troponin:  No results for input(s): TROPONINIHS in the last 720 hours.   Chemistry Recent Labs  Lab 12/18/19 2351 12/18/19 2356  NA 136 136  K 4.5 4.4  CL 103 104  CO2 21*  --   GLUCOSE 103* 99  BUN 28* 31*  CREATININE 1.24 1.10  CALCIUM 9.1  --   GFRNONAA 55*  --   GFRAA >60  --   ANIONGAP 12  --     Recent Labs  Lab 12/18/19 2351  PROT 6.5  ALBUMIN 3.8  AST 16  ALT 19  ALKPHOS 50  BILITOT 0.7   Hematology Recent Labs  Lab 12/18/19 2351 12/18/19 2356  WBC 7.5  --   RBC 5.55  --   HGB 16.9 17.3*  HCT 51.7 51.0  MCV 93.2  --   MCH 30.5  --   MCHC 32.7  --   RDW 13.4  --   PLT 169  --    BNPNo results for input(s): BNP, PROBNP in the last 168 hours.  DDimer No results for input(s): DDIMER in the last 168 hours.   Radiology/Studies:  CT Code Stroke CTA Head W/WO contrast  Result Date: 12/19/2019 CLINICAL DATA:  Aphasia EXAM: CT ANGIOGRAPHY HEAD AND NECK CT PERFUSION BRAIN TECHNIQUE: Multidetector CT imaging of the head and neck was performed using the standard protocol during bolus administration of intravenous contrast. Multiplanar CT image reconstructions and MIPs  were obtained to evaluate the vascular anatomy. Carotid stenosis measurements (when  applicable) are obtained utilizing NASCET criteria, using the distal internal carotid diameter as the denominator. Multiphase CT imaging of the brain was performed following IV bolus contrast injection. Subsequent parametric perfusion maps were calculated using RAPID software. CONTRAST:  OMNIPAQUE IOHEXOL 350 MG/ML SOLN COMPARISON:  None. FINDINGS: CTA NECK FINDINGS SKELETON: There is no bony spinal canal stenosis. No lytic or blastic lesion. OTHER NECK: Normal pharynx, larynx and major salivary glands. No cervical lymphadenopathy. Unremarkable thyroid gland. UPPER CHEST: No pneumothorax or pleural effusion. No nodules or masses. AORTIC ARCH: There is no calcific atherosclerosis of the aortic arch. There is no aneurysm, dissection or hemodynamically significant stenosis of the visualized portion of the aorta. Conventional 3 vessel aortic branching pattern. The visualized proximal subclavian arteries are widely patent. RIGHT CAROTID SYSTEM: No dissection, occlusion or aneurysm. Mild atherosclerotic calcification at the carotid bifurcation without hemodynamically significant stenosis. LEFT CAROTID SYSTEM: No dissection, occlusion or aneurysm. Mild atherosclerotic calcification at the carotid bifurcation without hemodynamically significant stenosis. VERTEBRAL ARTERIES: Left dominant configuration. Moderate calcification of the left vertebral artery origin. There is no dissection, occlusion or flow-limiting stenosis to the skull base (V1-V3 segments). CTA HEAD FINDINGS POSTERIOR CIRCULATION: --Vertebral arteries: Bilateral atherosclerotic calcification of the V4 segments with severe stenosis on the right. No stenosis on the left. --Posterior inferior cerebellar arteries (PICA): Patent origins from the vertebral arteries. --Anterior inferior cerebellar arteries (AICA): Patent origins from the basilar artery. --Basilar artery: Normal. --Superior cerebellar arteries: Normal. --Posterior cerebral arteries: Normal.  Both originate from the basilar artery. Posterior communicating arteries (p-comm) are diminutive or absent. ANTERIOR CIRCULATION: --Intracranial internal carotid arteries: Atherosclerotic calcification of the internal carotid arteries at the skull base without hemodynamically significant stenosis. --Anterior cerebral arteries (ACA): Normal. Both A1 segments are present. Patent anterior communicating artery (a-comm). --Middle cerebral arteries (MCA): There is moderate stenosis of the distal right MCA M1 segment. There is no proximal occlusion. There is mild narrowing of the distal left M1 segment. The M2 branches are patent. There is tapering of an M3 branch in the area of the frontal operculum infarct. VENOUS SINUSES: As permitted by contrast timing, patent. ANATOMIC VARIANTS: None Review of the MIP images confirms the above findings. CT Brain Perfusion Findings: ASPECTS: 8 CBF (<30%) Volume: 11mL Perfusion (Tmax>6.0s) volume: 36mL Mismatch Volume: 25mL Infarction Location:Left frontal operculum IMPRESSION: 1. No emergent large vessel occlusion. 2. 11 mL core infarct in the left frontal operculum with surrounding 25 mL ischemic penumbra. 3. Mild narrowing of the distal left M1 segment with tapering of an M3 branch in the area of the frontal operculum infarct. 4. 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 5. Mild bilateral carotid bifurcation atherosclerosis without hemodynamically significant stenosis. 6. Moderate stenosis of the distal right MCA M1 segment. These results were called by telephone at the time of interpretation on 12/19/2019 at 12:30 am to provider ERIC [facility] , who verbally acknowledged these results. Electronically Signed   By: Deatra Robinson M.D.   On: 12/19/2019 00:35   DG Chest 2 View  Result Date: 12/19/2019 CLINICAL DATA:  Infarct EXAM: CHEST - 2 VIEW COMPARISON:  None. FINDINGS: Mild cardiomegaly and remote median sternotomy. No focal airspace consolidation or  pulmonary edema. No pleural effusion or pneumothorax. IMPRESSION: No active cardiopulmonary disease. Electronically Signed   By: Deatra Robinson M.D.   On: 12/19/2019 03:00   CT Code Stroke CTA Neck W/WO contrast  Result Date:  12/19/2019 CLINICAL DATA:  Aphasia EXAM: CT ANGIOGRAPHY HEAD AND NECK CT PERFUSION BRAIN TECHNIQUE: Multidetector CT imaging of the head and neck was performed using the standard protocol during bolus administration of intravenous contrast. Multiplanar CT image reconstructions and MIPs were obtained to evaluate the vascular anatomy. Carotid stenosis measurements (when applicable) are obtained utilizing NASCET criteria, using the distal internal carotid diameter as the denominator. Multiphase CT imaging of the brain was performed following IV bolus contrast injection. Subsequent parametric perfusion maps were calculated using RAPID software. CONTRAST:  OMNIPAQUE IOHEXOL 350 MG/ML SOLN COMPARISON:  None. FINDINGS: CTA NECK FINDINGS SKELETON: There is no bony spinal canal stenosis. No lytic or blastic lesion. OTHER NECK: Normal pharynx, larynx and major salivary glands. No cervical lymphadenopathy. Unremarkable thyroid gland. UPPER CHEST: No pneumothorax or pleural effusion. No nodules or masses. AORTIC ARCH: There is no calcific atherosclerosis of the aortic arch. There is no aneurysm, dissection or hemodynamically significant stenosis of the visualized portion of the aorta. Conventional 3 vessel aortic branching pattern. The visualized proximal subclavian arteries are widely patent. RIGHT CAROTID SYSTEM: No dissection, occlusion or aneurysm. Mild atherosclerotic calcification at the carotid bifurcation without hemodynamically significant stenosis. LEFT CAROTID SYSTEM: No dissection, occlusion or aneurysm. Mild atherosclerotic calcification at the carotid bifurcation without hemodynamically significant stenosis. VERTEBRAL ARTERIES: Left dominant configuration. Moderate calcification of  the left vertebral artery origin. There is no dissection, occlusion or flow-limiting stenosis to the skull base (V1-V3 segments). CTA HEAD FINDINGS POSTERIOR CIRCULATION: --Vertebral arteries: Bilateral atherosclerotic calcification of the V4 segments with severe stenosis on the right. No stenosis on the left. --Posterior inferior cerebellar arteries (PICA): Patent origins from the vertebral arteries. --Anterior inferior cerebellar arteries (AICA): Patent origins from the basilar artery. --Basilar artery: Normal. --Superior cerebellar arteries: Normal. --Posterior cerebral arteries: Normal. Both originate from the basilar artery. Posterior communicating arteries (p-comm) are diminutive or absent. ANTERIOR CIRCULATION: --Intracranial internal carotid arteries: Atherosclerotic calcification of the internal carotid arteries at the skull base without hemodynamically significant stenosis. --Anterior cerebral arteries (ACA): Normal. Both A1 segments are present. Patent anterior communicating artery (a-comm). --Middle cerebral arteries (MCA): There is moderate stenosis of the distal right MCA M1 segment. There is no proximal occlusion. There is mild narrowing of the distal left M1 segment. The M2 branches are patent. There is tapering of an M3 branch in the area of the frontal operculum infarct. VENOUS SINUSES: As permitted by contrast timing, patent. ANATOMIC VARIANTS: None Review of the MIP images confirms the above findings. CT Brain Perfusion Findings: ASPECTS: 8 CBF (<30%) Volume: 90mL Perfusion (Tmax>6.0s) volume: 58mL Mismatch Volume: 57mL Infarction Location:Left frontal operculum IMPRESSION: 1. No emergent large vessel occlusion. 2. 11 mL core infarct in the left frontal operculum with surrounding 25 mL ischemic penumbra. 3. Mild narrowing of the distal left M1 segment with tapering of an M3 branch in the area of the frontal operculum infarct. 4. 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 5.  Mild bilateral carotid bifurcation atherosclerosis without hemodynamically significant stenosis. 6. Moderate stenosis of the distal right MCA M1 segment. These results were called by telephone at the time of interpretation on 12/19/2019 at 12:30 am to provider ERIC [facility] , who verbally acknowledged these results. Electronically Signed   By: Deatra Robinson M.D.   On: 12/19/2019 00:35   MR BRAIN WO CONTRAST  Result Date: 12/19/2019 CLINICAL DATA:  Acute onset of aphasia. EXAM: MRI HEAD WITHOUT CONTRAST TECHNIQUE: Multiplanar, multiecho pulse sequences of the brain and surrounding  structures were obtained without intravenous contrast. COMPARISON:  Head CT 12/19/2019 FINDINGS: BRAIN: Intermediate sized acute infarct of the left frontal operculum. Early confluent hyperintense T2-weighted signal of the periventricular and deep white matter, most commonly due to chronic ischemic microangiopathy. 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 The cerebral and cerebellar volume are age-appropriate. There is no hydrocephalus. The midline structures are normal. VASCULAR: The major intracranial arterial and venous sinus flow voids are normal. Hemosiderin deposition at the site of old left parietal infarct. No acute hemorrhage. SKULL AND UPPER CERVICAL SPINE: Calvarial bone marrow signal is normal. There is no skull base mass. The visualized upper cervical spine and soft tissues are normal. SINUSES/ORBITS: Mild left maxillary sinus mucosal thickening. The orbits are normal. IMPRESSION: 1. Intermediate sized acute infarct of the left frontal operculum. No hemorrhage or mass effect. 2. 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 3. Moderate chronic small vessel disease. Electronically Signed   By: Deatra Robinson M.D.   On: 12/19/2019 02:49   CT Code Stroke Cerebral Perfusion with contrast  Result Date: 12/19/2019 CLINICAL DATA:  Aphasia EXAM: CT ANGIOGRAPHY HEAD AND NECK CT PERFUSION BRAIN  TECHNIQUE: Multidetector CT imaging of the head and neck was performed using the standard protocol during bolus administration of intravenous contrast. Multiplanar CT image reconstructions and MIPs were obtained to evaluate the vascular anatomy. Carotid stenosis measurements (when applicable) are obtained utilizing NASCET criteria, using the distal internal carotid diameter as the denominator. Multiphase CT imaging of the brain was performed following IV bolus contrast injection. Subsequent parametric perfusion maps were calculated using RAPID software. CONTRAST:  OMNIPAQUE IOHEXOL 350 MG/ML SOLN COMPARISON:  None. FINDINGS: CTA NECK FINDINGS SKELETON: There is no bony spinal canal stenosis. No lytic or blastic lesion. OTHER NECK: Normal pharynx, larynx and major salivary glands. No cervical lymphadenopathy. Unremarkable thyroid gland. UPPER CHEST: No pneumothorax or pleural effusion. No nodules or masses. AORTIC ARCH: There is no calcific atherosclerosis of the aortic arch. There is no aneurysm, dissection or hemodynamically significant stenosis of the visualized portion of the aorta. Conventional 3 vessel aortic branching pattern. The visualized proximal subclavian arteries are widely patent. RIGHT CAROTID SYSTEM: No dissection, occlusion or aneurysm. Mild atherosclerotic calcification at the carotid bifurcation without hemodynamically significant stenosis. LEFT CAROTID SYSTEM: No dissection, occlusion or aneurysm. Mild atherosclerotic calcification at the carotid bifurcation without hemodynamically significant stenosis. VERTEBRAL ARTERIES: Left dominant configuration. Moderate calcification of the left vertebral artery origin. There is no dissection, occlusion or flow-limiting stenosis to the skull base (V1-V3 segments). CTA HEAD FINDINGS POSTERIOR CIRCULATION: --Vertebral arteries: Bilateral atherosclerotic calcification of the V4 segments with severe stenosis on the right. No stenosis on the left.  --Posterior inferior cerebellar arteries (PICA): Patent origins from the vertebral arteries. --Anterior inferior cerebellar arteries (AICA): Patent origins from the basilar artery. --Basilar artery: Normal. --Superior cerebellar arteries: Normal. --Posterior cerebral arteries: Normal. Both originate from the basilar artery. Posterior communicating arteries (p-comm) are diminutive or absent. ANTERIOR CIRCULATION: --Intracranial internal carotid arteries: Atherosclerotic calcification of the internal carotid arteries at the skull base without hemodynamically significant stenosis. --Anterior cerebral arteries (ACA): Normal. Both A1 segments are present. Patent anterior communicating artery (a-comm). --Middle cerebral arteries (MCA): There is moderate stenosis of the distal right MCA M1 segment. There is no proximal occlusion. There is mild narrowing of the distal left M1 segment. The M2 branches are patent. There is tapering of an M3 branch in the area of the frontal operculum infarct. VENOUS SINUSES: As permitted  by contrast timing, patent. ANATOMIC VARIANTS: None Review of the MIP images confirms the above findings. CT Brain Perfusion Findings: ASPECTS: 8 CBF (<30%) Volume: 11mL Perfusion (Tmax>6.0s) volume: 36mL Mismatch Volume: 25mL Infarction Location:Left frontal operculum IMPRESSION: 1. No emergent large vessel occlusion. 2. 11 mL core infarct in the left frontal operculum with surrounding 25 mL ischemic penumbra. 3. Mild narrowing of the distal left M1 segment with tapering of an M3 branch in the area of the frontal operculum infarct. 4. 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 5. Mild bilateral carotid bifurcation atherosclerosis without hemodynamically significant stenosis. 6. Moderate stenosis of the distal right MCA M1 segment. These results were called by telephone at the time of interpretation on 12/19/2019 at 12:30 am to [facility] , who verbally acknowledged these  results. Electronically Signed   By: Deatra RobinsonKevin  Herman M.D.   On: 12/19/2019 00:35   ECHOCARDIOGRAM COMPLETE  Result Date: 12/19/2019   ECHOCARDIOGRAM REPORT   Patient Name:   Raymond GrammesRICHARD Vance Date of Exam: 12/19/2019 Medical Rec #:  [id number]    Height:       71.0 in Accession #:    [id number]   Weight:       215.6 lb Date of Birth:  Nov 29, 1941    BSA:          2.18 m Patient Age:    78 years     BP:           118/64 mmHg Patient Gender: M            HR:           37 bpm. Exam Location:  Inpatient Procedure: 2D Echo, Cardiac Doppler, Color Doppler and Intracardiac            Opacification Agent Indications:    Stroke  History:        Patient has no prior history of Echocardiogram examinations. CAD                 and Previous Myocardial Infarction, Prior CABG and Abnormal ECG,                 Stroke and COPD; Risk Factors:Hypertension.  Sonographer:    Sheralyn Boatmanina West RDCS Referring Phys: [phone] JARED M GARDNER  Sonographer Comments: Technically difficult study due to poor echo windows, no subcostal window, suboptimal subcostal window and suboptimal apical window. IMPRESSIONS  1. Left ventricular ejection fraction, by visual estimation, is 20 to 25%. The left ventricle has severely decreased function. There is moderately increased left ventricular hypertrophy.  2. Definity contrast agent was given IV to delineate the left ventricular endocardial borders. No mural thrombus was noted.  3. Left ventricular diastolic function could not be evaluated.  4. The left ventricle demonstrates global hypokinesis.  5. Global right ventricle has normal systolic function.The right ventricular size is normal. No increase in right ventricular wall thickness.  6. Left atrial size was moderately dilated.  7. Right atrial size was mildly dilated.  8. The mitral valve is grossly normal. Trivial mitral valve regurgitation.  9. The tricuspid valve is grossly normal. 10. The aortic valve is tricuspid. Aortic valve regurgitation is trivial. Mild  aortic valve sclerosis without stenosis. 11. The pulmonic valve was not well visualized. Pulmonic valve regurgitation is not visualized. 12. Aortic dilatation noted. 13. 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 FINDINGS  Left Ventricle: Left ventricular ejection fraction, by visual estimation, is 20 to  25%. The left ventricle has severely decreased function. Definity contrast agent was given IV to delineate the left ventricular endocardial borders. The left ventricle demonstrates global hypokinesis. There is moderately increased left ventricular hypertrophy. The left ventricular diastology could not be evaluated due to indeterminate diastolic function. Left ventricular diastolic function could not be evaluated. Right Ventricle: The right ventricular size is normal. No increase in right ventricular wall thickness. Global RV systolic function is has normal systolic function. Left Atrium: Left atrial size was moderately dilated. Right Atrium: Right atrial size was mildly dilated Pericardium: There is no evidence of pericardial effusion. Mitral Valve: The mitral valve is grossly normal. There is mild thickening of the mitral valve leaflet(s). Trivial mitral valve regurgitation. Tricuspid Valve: The tricuspid valve is grossly normal. Tricuspid valve regurgitation is trivial. Aortic Valve: The aortic valve is tricuspid. Aortic valve regurgitation is trivial. Mild aortic valve sclerosis is present, with no evidence of aortic valve stenosis. Aortic valve mean gradient measures 7.5 mmHg. Aortic valve peak gradient measures 15.4 mmHg. Aortic valve area, by VTI measures 4.38 cm. Pulmonic Valve: The pulmonic valve was not well visualized. Pulmonic valve regurgitation is not visualized. Pulmonic regurgitation is not visualized. Aorta: Aortic dilatation noted. 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 Venous: The inferior vena cava was not well visualized. IAS/Shunts: No atrial  level shunt detected by color flow Doppler.  LEFT VENTRICLE PLAX 2D LVIDd:         6.60 cm LVIDs:         5.90 cm LV PW:         1.50 cm LV IVS:        1.60 cm LVOT diam:     2.70 cm LV SV:         50 ml LV SV Index:   22.53 LVOT Area:     5.73 cm  LV Volumes (MOD) LV area d, A4C:    52.90 cm LV area s, A4C:    44.30 cm LV major d, A4C:   9.50 cm LV major s, A4C:   8.62 cm LV vol d, MOD A4C: 226.0 ml LV vol s, MOD A4C: 175.0 ml LV SV MOD A4C:     226.0 ml RIGHT VENTRICLE         IVC TAPSE (M-mode): 1.7 cm  IVC diam: 2.70 cm LEFT ATRIUM              Index       RIGHT ATRIUM           Index LA diam:        4.10 cm  1.88 cm/m  RA Area:     23.60 cm LA Vol (A2C):   104.0 ml 47.77 ml/m RA Volume:   77.40 ml  35.55 ml/m LA Vol (A4C):   71.6 ml  32.89 ml/m LA Biplane Vol: 90.4 ml  41.53 ml/m  AORTIC VALVE AV Area (Vmax):    4.27 cm AV Area (Vmean):   4.53 cm AV Area (VTI):     4.38 cm AV Vmax:           196.50 cm/s AV Vmean:          125.000 cm/s AV VTI:            0.412 m AV Peak Grad:      15.4 mmHg AV Mean Grad:      7.5 mmHg LVOT Vmax:         146.50 cm/s  LVOT Vmean:        98.950 cm/s LVOT VTI:          0.316 m LVOT/AV VTI ratio: 0.77  AORTA Ao Root diam: 3.60 cm Ao Asc diam:  4.00 cm MITRAL VALVE MV Area (PHT): 4.06 cm             SHUNTS MV PHT:        54.13 msec           Systemic VTI:  0.32 m MV Decel Time: 187 msec             Systemic Diam: 2.70 cm MV E velocity: 85.87 cm/s 103 cm/s MV A velocity: 83.90 cm/s 70.3 cm/s MV E/A ratio:  1.02       1.5  Zoila Shutter MD Electronically signed by Zoila Shutter MD Signature Date/Time: 12/19/2019/12:00:27 PM    Final    CT HEAD CODE STROKE WO CONTRAST  Result Date: 12/19/2019 CLINICAL DATA:  Code stroke.  Confusion and aphasia EXAM: CT HEAD WITHOUT CONTRAST TECHNIQUE: Contiguous axial images were obtained from the base of the skull through the vertex without intravenous contrast. COMPARISON:  None. FINDINGS: Brain: There is no mass, hemorrhage or  extra-axial collection. The size and configuration of the ventricles and extra-axial CSF spaces are normal. There is hypoattenuation of the periventricular white matter, most commonly indicating chronic ischemic microangiopathy. There is an area of acute or early subacute ischemia within the anterior left MCA distribution, involving the left frontal operculum. 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 Vascular: No abnormal hyperdensity of the major intracranial arteries or dural venous sinuses. No intracranial atherosclerosis. Skull: The visualized skull base, calvarium and extracranial soft tissues are normal. Sinuses/Orbits: Moderate left maxillary sinus mucosal thickening. The orbits are normal. ASPECTS [facility] Stroke Program Early CT Score) - Ganglionic level infarction (caudate, lentiform nuclei, internal capsule, insula, M1-M3 cortex): 6 - Supraganglionic infarction (M4-M6 cortex): 2 Total score (0-10 with 10 being normal): 8 IMPRESSION: 1. 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 2. No hemorrhage. 3. 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 4. ASPECTS is 8. 5. These results were communicated to Dr. Caryl Pina at 12:10 am on 12/19/2019 by text page via the [facility] messaging system. Electronically Signed   By: Deatra Robinson M.D.   On: 12/19/2019 00:12   {  Assessment and Plan:   Acute CVA - patient came in with aphasia. CT head showed acute/early subacute ischemia of anterior left MCA. MRI showed intermediate sized infarct of left frontal section.  - Neurology following - PT/OT recommending CIR - per neurology ASA 325 mg daily and Lipitor 40 mg with anticoagulation in a couple days  Ischemic cardiomyopathy/Chronic systolic Heart Failure - EF was 30-35% in 2018, inferior wall  and posterior wall were hypokinetic - Echo here showed EF 20-25% moderate LVH, diastolic function could not be  evaluated, global RV normal systolic function, no mural thrombus, global hypokinesis, moderately dilated LA and RA, trivial TR, trivial AR, mild AV sclerosis, aortic dilation noted - At baseline is on lisinopril, metoprolol, HCTZ, and ?spiro - Has been restarted on Metoprolol 25 mg BID - Lisinopril has been stopped.Would discontinue ACE with plan to add Entresto once pressures allow.  - creatinine 1.10 - With new low EF would consider cath for ischemic evaluation once acute issue has resolved. 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  Agree as above, with the following exceptions and changes as noted below.  Patient is a pleasant 78 year old gentleman seen today with his wife who has acute stroke manifesting with aphasia.  Patient is able to produce a full sentence for me on examination, but otherwise is having difficulty with communication.  He denies current chest pain.  He notes it has been a long time since he seen a cardiologist after his bypass surgery.  Bypass surgery was in 2006, he is known to have a reduced ejection fraction. Gen: NAD, CV: RRR, no murmurs, Lungs: clear, Abd: soft, Extrem: Warm, well perfused, no edema, Neuro/Psych: alert and responds with head nods or shakes. All available labs, radiology testing, previous records reviewed.  He has a worsening of his known reduced ejection fraction.  Last documentation of an echocardiogram is in 2018 at [facility]  with an EF of 30 to 35%, worse today on echo demonstrating 20 to 25%.  Patient is on a good medical regimen at home, however we will need to optimize therapy for heart failure with reduced systolic function.  Consider Entresto when patient no longer requires permissive hypertension for stroke.  In addition, would recommend metoprolol succinate for the indication of reduced ejection fraction.  Hold ACE inhibitor in anticipation of starting Entresto.  Unclear if he takes spironolactone, this can be held while we optimize primary heart failure therapy.  Aspirin and statin per neurology, agree.  Outside of the setting of ACS, no cardiovascular indication for Plavix.  Neurology is planning to start Eliquis with concerned that his reduced ejection fraction may be the source of his stroke.  Repeat assessment of his ejection fraction should be performed in a few months, and he may warrant repeat ischemic evaluation at follow-up.  I will arrange follow-up in [facility] which is his preference due to his wife not being able to drive in [place].  Follow-up will be in 2 to 4 weeks.  Elouise Munroe 12/20/19 10:45 PM

## 2019-12-20 NOTE — Progress Notes (Signed)
PROGRESS NOTE    Raymond Vance  KGM:[id number] DOB: 06/03/1941 DOA: 12/18/2019 PCP: Wilburn Mylar, MD   Brief Narrative: Raymond Vance is a 78 y.o. male with medical history significant of HTN, CAD s/p CABG, chronic systolic heart failure. Patient presented secondary to aphasia and found to have an acute/subacute stroke involving the anterior left MCA area.    Assessment & Plan:   Principal Problem:   Acute ischemic stroke [facility]) Active Problems:   HTN (hypertension)   CAD (coronary artery disease)   Severe aphasia   Acute CVA Resultant severe aphasia. CT head significant for acute/early subacute ischemia of anterior left MCA distribution. MRI significant for intermediate sized acute infarct of left frontal operculum. LDL of 112, hemoglobin A1C of 6.2, Transthoracic Echocardiogram pending. Patient started on aspirin. Neurology consulted. PT/OT recommending CIR. Speech therapy recommending dysphagia 3 diet. Discussed with wife and she is thinking more likely home health. -Neurology recommendations: Aspirin 325 mg daily, Lipitor 40 mg, anticoagulation eventually -PT/OT recommendations  Essential hypertension Patient is on hydrochlorothiazide, lisinopril, spironolactone as an outpatient. Antihypertensive medications held in setting of acute stroke. Blood pressure somewhat controlled. -Will add home metoprolol tartrate today (per review, takes daily so will decrease dose to 25 mg and increased frequency to BID) -Add antihypertensives to decrease blood pressure gradually  CAD Hyperlipidemia S/p CABG. History of MI. Patient is on medications as listed below in addition to aspirin 81 mg and Lipitor 40 mg daily  Chronic systolic heart failure On chart review, appears last EF was 33% from Transthoracic Echocardiogram in 2018. Per wife, patient has not followed with cardiology in years. EF this admission of 20-25%. Patient is on lisinopril, metoprolol, spironolactone as an  outpatient. -Will consult cardiology while inpatient. Per discussion with wife, they would prefer follow-up in [facility] if possible.  COPD No wheezing. -Continue Symbicort BID [facility] inpatient) and Albuterol prn   DVT prophylaxis: Lovenox Code Status:   Code Status: Full Code Family Communication: None Disposition Plan: Discharge pending stroke workup and therapy recommendations   Consultants:   Neurology  Cardiology  Procedures:   12/28: Transthoracic Echocardiogram IMPRESSIONS    1. Left ventricular ejection fraction, by visual estimation, is 20 to 25%. The left ventricle has severely decreased function. There is moderately increased left ventricular hypertrophy.  2. Definity contrast agent was given IV to delineate the left ventricular endocardial borders. No mural thrombus was noted.  3. Left ventricular diastolic function could not be evaluated.  4. The left ventricle demonstrates global hypokinesis.  5. Global right ventricle has normal systolic function.The right ventricular size is normal. No increase in right ventricular wall thickness.  6. Left atrial size was moderately dilated.  7. Right atrial size was mildly dilated.  8. The mitral valve is grossly normal. Trivial mitral valve regurgitation.  9. The tricuspid valve is grossly normal. 10. The aortic valve is tricuspid. Aortic valve regurgitation is trivial. Mild aortic valve sclerosis without stenosis. 11. The pulmonic valve was not well visualized. Pulmonic valve regurgitation is not visualized. 12. Aortic dilatation noted. 13. 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   Antimicrobials:  None    Subjective: Aphasia. Shakes head when asked if any concerns.  Objective: Vitals:   12/20/19 0000 12/20/19 0358 12/20/19 0844 12/20/19 1211  BP: 121/76 (!) 141/78    Pulse: 61 64    Resp: 20 18    Temp: 98.5 F (36.9 C) 98.5 F (36.9 C) 98.1 F (36.7 C) 97.8 F (  36.6 C)   TempSrc: Oral Oral Oral Oral  SpO2: 100% 98%    Weight:      Height:        Intake/Output Summary (Last 24 hours) at 12/20/2019 1311 Last data filed at 12/20/2019 0843 Gross per 24 hour  Intake 872.31 ml  Output 825 ml  Net 47.31 ml   Filed Weights   12/19/19 0108 12/19/19 0359  Weight: 99.2 kg 97.8 kg    Examination:  General exam: Appears calm and comfortable Respiratory system: Clear to auscultation. Respiratory effort normal. Cardiovascular system: S1 & S2 heard, RRR. No murmurs, rubs, gallops or clicks. Gastrointestinal system: Abdomen is nondistended, soft and nontender. No organomegaly or masses felt. Normal bowel sounds heard. Central nervous system: Alert and oriented. Aphasia. 4/5 motor strength. Extremities: No edema. No calf tenderness Skin: No cyanosis. No rashes Psychiatry: Judgement and insight appear normal. Mood & affect appropriate.     Data Reviewed: I have personally reviewed following labs and imaging studies  CBC: Recent Labs  Lab 12/18/19 2351 12/18/19 2356  WBC 7.5  --   NEUTROABS 4.2  --   HGB 16.9 17.3*  HCT 51.7 51.0  MCV 93.2  --   PLT 169  --    Basic Metabolic Panel: Recent Labs  Lab 12/18/19 2351 12/18/19 2356  NA 136 136  K 4.5 4.4  CL 103 104  CO2 21*  --   GLUCOSE 103* 99  BUN 28* 31*  CREATININE 1.24 1.10  CALCIUM 9.1  --    GFR: Estimated Creatinine Clearance: 64.9 mL/min (by C-G formula based on SCr of 1.1 mg/dL). Liver Function Tests: Recent Labs  Lab 12/18/19 2351  AST 16  ALT 19  ALKPHOS 50  BILITOT 0.7  PROT 6.5  ALBUMIN 3.8   No results for input(s): LIPASE, AMYLASE in the last 168 hours. No results for input(s): AMMONIA in the last 168 hours. Coagulation Profile: Recent Labs  Lab 12/18/19 2351  INR 1.0   Cardiac Enzymes: No results for input(s): CKTOTAL, CKMB, CKMBINDEX, TROPONINI in the last 168 hours. BNP (last 3 results) No results for input(s): PROBNP in the last 8760  hours. HbA1C: Recent Labs    12/19/19 0332  HGBA1C 6.2*   CBG: Recent Labs  Lab 12/18/19 2350  GLUCAP 100*   Lipid Profile: Recent Labs    12/19/19 0332  CHOL 179  HDL 50  LDLCALC 112*  TRIG 87  CHOLHDL 3.6   Thyroid Function Tests: No results for input(s): TSH, T4TOTAL, FREET4, T3FREE, THYROIDAB in the last 72 hours. Anemia Panel: No results for input(s): VITAMINB12, FOLATE, FERRITIN, TIBC, IRON, RETICCTPCT in the last 72 hours. Sepsis Labs: No results for input(s): PROCALCITON, LATICACIDVEN in the last 168 hours.  Recent Results (from the past 240 hour(s))  Respiratory Panel by RT PCR (Flu A&B, Covid) - Nasopharyngeal Swab     Status: None   Collection Time: 12/19/19 12:44 AM   Specimen: Nasopharyngeal Swab  Result Value Ref Range Status   SARS Coronavirus 2 by RT PCR NEGATIVE NEGATIVE Final    Comment: (NOTE) SARS-CoV-2 target nucleic acids are NOT DETECTED. The SARS-CoV-2 RNA is generally detectable in upper respiratoy specimens during the acute phase of infection. The lowest concentration of SARS-CoV-2 viral copies this assay can detect is 131 copies/mL. A negative result does not preclude SARS-Cov-2 infection and should not be used as the sole basis for treatment or other patient management decisions. A negative result may occur with  improper specimen  collection/handling, submission of specimen other than nasopharyngeal swab, presence of viral mutation(s) within the areas targeted by this assay, and inadequate number of viral copies (<131 copies/mL). A negative result must be combined with clinical observations, patient history, and epidemiological information. The expected result is Negative. Fact Sheet for Patients:  https://www.moore.com/ Fact Sheet for Healthcare Providers:  https://www.young.biz/ This test is not yet ap proved or cleared by the [place] FDA and  has been authorized for detection and/or  diagnosis of SARS-CoV-2 by FDA under an Emergency Use Authorization (EUA). This EUA will remain  in effect (meaning this test can be used) for the duration of the COVID-19 declaration under Section 564(b)(1) of the Act, 21 U.S.C. section 360bbb-3(b)(1), unless the authorization is terminated or revoked sooner.    Influenza A by PCR NEGATIVE NEGATIVE Final   Influenza B by PCR NEGATIVE NEGATIVE Final    Comment: (NOTE) The Xpert Xpress SARS-CoV-2/FLU/RSV assay is intended as an aid in  the diagnosis of influenza from Nasopharyngeal swab specimens and  should not be used as a sole basis for treatment. Nasal washings and  aspirates are unacceptable for Xpert Xpress SARS-CoV-2/FLU/RSV  testing. Fact Sheet for Patients: https://www.moore.com/ Fact Sheet for Healthcare Providers: https://www.young.biz/ This test is not yet approved or cleared by the [place] FDA and  has been authorized for detection and/or diagnosis of SARS-CoV-2 by  FDA under an Emergency Use Authorization (EUA). This EUA will remain  in effect (meaning this test can be used) for the duration of the  Covid-19 declaration under Section 564(b)(1) of the Act, 21  U.S.C. section 360bbb-3(b)(1), unless the authorization is  terminated or revoked. Performed at [facility], [address], [place], [place] [address]          Radiology Studies: CT Code Stroke CTA Head W/WO contrast  Result Date: 12/19/2019 CLINICAL DATA:  Aphasia EXAM: CT ANGIOGRAPHY HEAD AND NECK CT PERFUSION BRAIN TECHNIQUE: Multidetector CT imaging of the head and neck was performed using the standard protocol during bolus administration of intravenous contrast. Multiplanar CT image reconstructions and MIPs were obtained to evaluate the vascular anatomy. Carotid stenosis measurements (when applicable) are obtained utilizing NASCET criteria, using the distal internal carotid diameter as the denominator.  Multiphase CT imaging of the brain was performed following IV bolus contrast injection. Subsequent parametric perfusion maps were calculated using RAPID software. CONTRAST:  OMNIPAQUE IOHEXOL 350 MG/ML SOLN COMPARISON:  None. FINDINGS: CTA NECK FINDINGS SKELETON: There is no bony spinal canal stenosis. No lytic or blastic lesion. OTHER NECK: Normal pharynx, larynx and major salivary glands. No cervical lymphadenopathy. Unremarkable thyroid gland. UPPER CHEST: No pneumothorax or pleural effusion. No nodules or masses. AORTIC ARCH: There is no calcific atherosclerosis of the aortic arch. There is no aneurysm, dissection or hemodynamically significant stenosis of the visualized portion of the aorta. Conventional 3 vessel aortic branching pattern. The visualized proximal subclavian arteries are widely patent. RIGHT CAROTID SYSTEM: No dissection, occlusion or aneurysm. Mild atherosclerotic calcification at the carotid bifurcation without hemodynamically significant stenosis. LEFT CAROTID SYSTEM: No dissection, occlusion or aneurysm. Mild atherosclerotic calcification at the carotid bifurcation without hemodynamically significant stenosis. VERTEBRAL ARTERIES: Left dominant configuration. Moderate calcification of the left vertebral artery origin. There is no dissection, occlusion or flow-limiting stenosis to the skull base (V1-V3 segments). CTA HEAD FINDINGS POSTERIOR CIRCULATION: --Vertebral arteries: Bilateral atherosclerotic calcification of the V4 segments with severe stenosis on the right. No stenosis on the left. --Posterior inferior cerebellar arteries (PICA): Patent origins  from the vertebral arteries. --Anterior inferior cerebellar arteries (AICA): Patent origins from the basilar artery. --Basilar artery: Normal. --Superior cerebellar arteries: Normal. --Posterior cerebral arteries: Normal. Both originate from the basilar artery. Posterior communicating arteries (p-comm) are diminutive or absent. ANTERIOR  CIRCULATION: --Intracranial internal carotid arteries: Atherosclerotic calcification of the internal carotid arteries at the skull base without hemodynamically significant stenosis. --Anterior cerebral arteries (ACA): Normal. Both A1 segments are present. Patent anterior communicating artery (a-comm). --Middle cerebral arteries (MCA): There is moderate stenosis of the distal right MCA M1 segment. There is no proximal occlusion. There is mild narrowing of the distal left M1 segment. The M2 branches are patent. There is tapering of an M3 branch in the area of the frontal operculum infarct. VENOUS SINUSES: As permitted by contrast timing, patent. ANATOMIC VARIANTS: None Review of the MIP images confirms the above findings. CT Brain Perfusion Findings: ASPECTS: 8 CBF (<30%) Volume: 11mL Perfusion (Tmax>6.0s) volume: 36mL Mismatch Volume: 25mL Infarction Location:Left frontal operculum IMPRESSION: 1. No emergent large vessel occlusion. 2. 11 mL core infarct in the left frontal operculum with surrounding 25 mL ischemic penumbra. 3. Mild narrowing of the distal left M1 segment with tapering of an M3 branch in the area of the frontal operculum infarct. 4. 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 5. Mild bilateral carotid bifurcation atherosclerosis without hemodynamically significant stenosis. 6. Moderate stenosis of the distal right MCA M1 segment. These results were called by telephone at the time of interpretation on 12/19/2019 at 12:30 am to provider ERIC [facility] , who verbally acknowledged these results. Electronically Signed   By: Deatra Robinson M.D.   On: 12/19/2019 00:35   DG Chest 2 View  Result Date: 12/19/2019 CLINICAL DATA:  Infarct EXAM: CHEST - 2 VIEW COMPARISON:  None. FINDINGS: Mild cardiomegaly and remote median sternotomy. No focal airspace consolidation or pulmonary edema. No pleural effusion or pneumothorax. IMPRESSION: No active cardiopulmonary disease. Electronically Signed    By: Deatra Robinson M.D.   On: 12/19/2019 03:00   CT Code Stroke CTA Neck W/WO contrast  Result Date: 12/19/2019 CLINICAL DATA:  Aphasia EXAM: CT ANGIOGRAPHY HEAD AND NECK CT PERFUSION BRAIN TECHNIQUE: Multidetector CT imaging of the head and neck was performed using the standard protocol during bolus administration of intravenous contrast. Multiplanar CT image reconstructions and MIPs were obtained to evaluate the vascular anatomy. Carotid stenosis measurements (when applicable) are obtained utilizing NASCET criteria, using the distal internal carotid diameter as the denominator. Multiphase CT imaging of the brain was performed following IV bolus contrast injection. Subsequent parametric perfusion maps were calculated using RAPID software. CONTRAST:  OMNIPAQUE IOHEXOL 350 MG/ML SOLN COMPARISON:  None. FINDINGS: CTA NECK FINDINGS SKELETON: There is no bony spinal canal stenosis. No lytic or blastic lesion. OTHER NECK: Normal pharynx, larynx and major salivary glands. No cervical lymphadenopathy. Unremarkable thyroid gland. UPPER CHEST: No pneumothorax or pleural effusion. No nodules or masses. AORTIC ARCH: There is no calcific atherosclerosis of the aortic arch. There is no aneurysm, dissection or hemodynamically significant stenosis of the visualized portion of the aorta. Conventional 3 vessel aortic branching pattern. The visualized proximal subclavian arteries are widely patent. RIGHT CAROTID SYSTEM: No dissection, occlusion or aneurysm. Mild atherosclerotic calcification at the carotid bifurcation without hemodynamically significant stenosis. LEFT CAROTID SYSTEM: No dissection, occlusion or aneurysm. Mild atherosclerotic calcification at the carotid bifurcation without hemodynamically significant stenosis. VERTEBRAL ARTERIES: Left dominant configuration. Moderate calcification of the left vertebral artery origin. There is no dissection, occlusion or  flow-limiting stenosis to the skull base (V1-V3  segments). CTA HEAD FINDINGS POSTERIOR CIRCULATION: --Vertebral arteries: Bilateral atherosclerotic calcification of the V4 segments with severe stenosis on the right. No stenosis on the left. --Posterior inferior cerebellar arteries (PICA): Patent origins from the vertebral arteries. --Anterior inferior cerebellar arteries (AICA): Patent origins from the basilar artery. --Basilar artery: Normal. --Superior cerebellar arteries: Normal. --Posterior cerebral arteries: Normal. Both originate from the basilar artery. Posterior communicating arteries (p-comm) are diminutive or absent. ANTERIOR CIRCULATION: --Intracranial internal carotid arteries: Atherosclerotic calcification of the internal carotid arteries at the skull base without hemodynamically significant stenosis. --Anterior cerebral arteries (ACA): Normal. Both A1 segments are present. Patent anterior communicating artery (a-comm). --Middle cerebral arteries (MCA): There is moderate stenosis of the distal right MCA M1 segment. There is no proximal occlusion. There is mild narrowing of the distal left M1 segment. The M2 branches are patent. There is tapering of an M3 branch in the area of the frontal operculum infarct. VENOUS SINUSES: As permitted by contrast timing, patent. ANATOMIC VARIANTS: None Review of the MIP images confirms the above findings. CT Brain Perfusion Findings: ASPECTS: 8 CBF (<30%) Volume: 13mL Perfusion (Tmax>6.0s) volume: 81mL Mismatch Volume: 25mL Infarction Location:Left frontal operculum IMPRESSION: 1. No emergent large vessel occlusion. 2. 11 mL core infarct in the left frontal operculum with surrounding 25 mL ischemic penumbra. 3. Mild narrowing of the distal left M1 segment with tapering of an M3 branch in the area of the frontal operculum infarct. 4. 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 5. Mild bilateral carotid bifurcation atherosclerosis without hemodynamically significant stenosis. 6. Moderate stenosis  of the distal right MCA M1 segment. These results were called by telephone at the time of interpretation on 12/19/2019 at 12:30 am to provider ERIC [facility] , who verbally acknowledged these results. Electronically Signed   By: Ulyses Jarred M.D.   On: 12/19/2019 00:35   MR BRAIN WO CONTRAST  Result Date: 12/19/2019 CLINICAL DATA:  Acute onset of aphasia. EXAM: MRI HEAD WITHOUT CONTRAST TECHNIQUE: Multiplanar, multiecho pulse sequences of the brain and surrounding structures were obtained without intravenous contrast. COMPARISON:  Head CT 12/19/2019 FINDINGS: BRAIN: Intermediate sized acute infarct of the left frontal operculum. Early confluent hyperintense T2-weighted signal of the periventricular and deep white matter, most commonly due to chronic ischemic microangiopathy. 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 The cerebral and cerebellar volume are age-appropriate. There is no hydrocephalus. The midline structures are normal. VASCULAR: The major intracranial arterial and venous sinus flow voids are normal. Hemosiderin deposition at the site of old left parietal infarct. No acute hemorrhage. SKULL AND UPPER CERVICAL SPINE: Calvarial bone marrow signal is normal. There is no skull base mass. The visualized upper cervical spine and soft tissues are normal. SINUSES/ORBITS: Mild left maxillary sinus mucosal thickening. The orbits are normal. IMPRESSION: 1. Intermediate sized acute infarct of the left frontal operculum. No hemorrhage or mass effect. 2. 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 3. Moderate chronic small vessel disease. Electronically Signed   By: Ulyses Jarred M.D.   On: 12/19/2019 02:49   CT Code Stroke Cerebral Perfusion with contrast  Result Date: 12/19/2019 CLINICAL DATA:  Aphasia EXAM: CT ANGIOGRAPHY HEAD AND NECK CT PERFUSION BRAIN TECHNIQUE: Multidetector CT imaging of the head and neck was performed using the standard protocol during bolus  administration of intravenous contrast. Multiplanar CT image reconstructions and MIPs were obtained to evaluate the vascular anatomy. Carotid stenosis measurements (when applicable)  are obtained utilizing NASCET criteria, using the distal internal carotid diameter as the denominator. Multiphase CT imaging of the brain was performed following IV bolus contrast injection. Subsequent parametric perfusion maps were calculated using RAPID software. CONTRAST:  OMNIPAQUE IOHEXOL 350 MG/ML SOLN COMPARISON:  None. FINDINGS: CTA NECK FINDINGS SKELETON: There is no bony spinal canal stenosis. No lytic or blastic lesion. OTHER NECK: Normal pharynx, larynx and major salivary glands. No cervical lymphadenopathy. Unremarkable thyroid gland. UPPER CHEST: No pneumothorax or pleural effusion. No nodules or masses. AORTIC ARCH: There is no calcific atherosclerosis of the aortic arch. There is no aneurysm, dissection or hemodynamically significant stenosis of the visualized portion of the aorta. Conventional 3 vessel aortic branching pattern. The visualized proximal subclavian arteries are widely patent. RIGHT CAROTID SYSTEM: No dissection, occlusion or aneurysm. Mild atherosclerotic calcification at the carotid bifurcation without hemodynamically significant stenosis. LEFT CAROTID SYSTEM: No dissection, occlusion or aneurysm. Mild atherosclerotic calcification at the carotid bifurcation without hemodynamically significant stenosis. VERTEBRAL ARTERIES: Left dominant configuration. Moderate calcification of the left vertebral artery origin. There is no dissection, occlusion or flow-limiting stenosis to the skull base (V1-V3 segments). CTA HEAD FINDINGS POSTERIOR CIRCULATION: --Vertebral arteries: Bilateral atherosclerotic calcification of the V4 segments with severe stenosis on the right. No stenosis on the left. --Posterior inferior cerebellar arteries (PICA): Patent origins from the vertebral arteries. --Anterior inferior  cerebellar arteries (AICA): Patent origins from the basilar artery. --Basilar artery: Normal. --Superior cerebellar arteries: Normal. --Posterior cerebral arteries: Normal. Both originate from the basilar artery. Posterior communicating arteries (p-comm) are diminutive or absent. ANTERIOR CIRCULATION: --Intracranial internal carotid arteries: Atherosclerotic calcification of the internal carotid arteries at the skull base without hemodynamically significant stenosis. --Anterior cerebral arteries (ACA): Normal. Both A1 segments are present. Patent anterior communicating artery (a-comm). --Middle cerebral arteries (MCA): There is moderate stenosis of the distal right MCA M1 segment. There is no proximal occlusion. There is mild narrowing of the distal left M1 segment. The M2 branches are patent. There is tapering of an M3 branch in the area of the frontal operculum infarct. VENOUS SINUSES: As permitted by contrast timing, patent. ANATOMIC VARIANTS: None Review of the MIP images confirms the above findings. CT Brain Perfusion Findings: ASPECTS: 8 CBF (<30%) Volume: 54mL Perfusion (Tmax>6.0s) volume: 56mL Mismatch Volume: 32mL Infarction Location:Left frontal operculum IMPRESSION: 1. No emergent large vessel occlusion. 2. 11 mL core infarct in the left frontal operculum with surrounding 25 mL ischemic penumbra. 3. Mild narrowing of the distal left M1 segment with tapering of an M3 branch in the area of the frontal operculum infarct. 4. 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 5. Mild bilateral carotid bifurcation atherosclerosis without hemodynamically significant stenosis. 6. Moderate stenosis of the distal right MCA M1 segment. These results were called by telephone at the time of interpretation on 12/19/2019 at 12:30 am to provider ERIC [facility] , who verbally acknowledged these results. Electronically Signed   By: Deatra Robinson M.D.   On: 12/19/2019 00:35   ECHOCARDIOGRAM COMPLETE  Result  Date: 12/19/2019   ECHOCARDIOGRAM REPORT   Patient Name:   ARCHIE SHEA Date of Exam: 12/19/2019 Medical Rec #:  [id number]    Height:       71.0 in Accession #:    [id number]   Weight:       215.6 lb Date of Birth:  [birth year]    BSA:          2.18 m Patient Age:    72 years  BP:           118/64 mmHg Patient Gender: M            HR:           37 bpm. Exam Location:  Inpatient Procedure: 2D Echo, Cardiac Doppler, Color Doppler and Intracardiac            Opacification Agent Indications:    Stroke  History:        Patient has no prior history of Echocardiogram examinations. CAD                 and Previous Myocardial Infarction, Prior CABG and Abnormal ECG,                 Stroke and COPD; Risk Factors:Hypertension.  Sonographer:    Sheralyn Boatman RDCS Referring Phys: [phone] JARED M GARDNER  Sonographer Comments: Technically difficult study due to poor echo windows, no subcostal window, suboptimal subcostal window and suboptimal apical window. IMPRESSIONS  1. Left ventricular ejection fraction, by visual estimation, is 20 to 25%. The left ventricle has severely decreased function. There is moderately increased left ventricular hypertrophy.  2. Definity contrast agent was given IV to delineate the left ventricular endocardial borders. No mural thrombus was noted.  3. Left ventricular diastolic function could not be evaluated.  4. The left ventricle demonstrates global hypokinesis.  5. Global right ventricle has normal systolic function.The right ventricular size is normal. No increase in right ventricular wall thickness.  6. Left atrial size was moderately dilated.  7. Right atrial size was mildly dilated.  8. The mitral valve is grossly normal. Trivial mitral valve regurgitation.  9. The tricuspid valve is grossly normal. 10. The aortic valve is tricuspid. Aortic valve regurgitation is trivial. Mild aortic valve sclerosis without stenosis. 11. The pulmonic valve was not well visualized. Pulmonic valve regurgitation is  not visualized. 12. Aortic dilatation noted. 13. 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 FINDINGS  Left Ventricle: Left ventricular ejection fraction, by visual estimation, is 20 to 25%. The left ventricle has severely decreased function. Definity contrast agent was given IV to delineate the left ventricular endocardial borders. The left ventricle demonstrates global hypokinesis. There is moderately increased left ventricular hypertrophy. The left ventricular diastology could not be evaluated due to indeterminate diastolic function. Left ventricular diastolic function could not be evaluated. Right Ventricle: The right ventricular size is normal. No increase in right ventricular wall thickness. Global RV systolic function is has normal systolic function. Left Atrium: Left atrial size was moderately dilated. Right Atrium: Right atrial size was mildly dilated Pericardium: There is no evidence of pericardial effusion. Mitral Valve: The mitral valve is grossly normal. There is mild thickening of the mitral valve leaflet(s). Trivial mitral valve regurgitation. Tricuspid Valve: The tricuspid valve is grossly normal. Tricuspid valve regurgitation is trivial. Aortic Valve: The aortic valve is tricuspid. Aortic valve regurgitation is trivial. Mild aortic valve sclerosis is present, with no evidence of aortic valve stenosis. Aortic valve mean gradient measures 7.5 mmHg. Aortic valve peak gradient measures 15.4 mmHg. Aortic valve area, by VTI measures 4.38 cm. Pulmonic Valve: The pulmonic valve was not well visualized. Pulmonic valve regurgitation is not visualized. Pulmonic regurgitation is not visualized. Aorta: Aortic dilatation noted. 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 Venous: The inferior vena cava was not well visualized. IAS/Shunts: No atrial level shunt detected by color flow Doppler.  LEFT VENTRICLE PLAX 2D LVIDd:  6.60 cm LVIDs:         5.90 cm LV PW:          1.50 cm LV IVS:        1.60 cm LVOT diam:     2.70 cm LV SV:         50 ml LV SV Index:   22.53 LVOT Area:     5.73 cm  LV Volumes (MOD) LV area d, A4C:    52.90 cm LV area s, A4C:    44.30 cm LV major d, A4C:   9.50 cm LV major s, A4C:   8.62 cm LV vol d, MOD A4C: 226.0 ml LV vol s, MOD A4C: 175.0 ml LV SV MOD A4C:     226.0 ml RIGHT VENTRICLE         IVC TAPSE (M-mode): 1.7 cm  IVC diam: 2.70 cm LEFT ATRIUM              Index       RIGHT ATRIUM           Index LA diam:        4.10 cm  1.88 cm/m  RA Area:     23.60 cm LA Vol (A2C):   104.0 ml 47.77 ml/m RA Volume:   77.40 ml  35.55 ml/m LA Vol (A4C):   71.6 ml  32.89 ml/m LA Biplane Vol: 90.4 ml  41.53 ml/m  AORTIC VALVE AV Area (Vmax):    4.27 cm AV Area (Vmean):   4.53 cm AV Area (VTI):     4.38 cm AV Vmax:           196.50 cm/s AV Vmean:          125.000 cm/s AV VTI:            0.412 m AV Peak Grad:      15.4 mmHg AV Mean Grad:      7.5 mmHg LVOT Vmax:         146.50 cm/s LVOT Vmean:        98.950 cm/s LVOT VTI:          0.316 m LVOT/AV VTI ratio: 0.77  AORTA Ao Root diam: 3.60 cm Ao Asc diam:  4.00 cm MITRAL VALVE MV Area (PHT): 4.06 cm             SHUNTS MV PHT:        54.13 msec           Systemic VTI:  0.32 m MV Decel Time: 187 msec             Systemic Diam: 2.70 cm MV E velocity: 85.87 cm/s 103 cm/s MV A velocity: 83.90 cm/s 70.3 cm/s MV E/A ratio:  1.02       1.5  Zoila Shutter MD Electronically signed by Zoila Shutter MD Signature Date/Time: 12/19/2019/12:00:27 PM    Final    CT HEAD CODE STROKE WO CONTRAST  Result Date: 12/19/2019 CLINICAL DATA:  Code stroke.  Confusion and aphasia EXAM: CT HEAD WITHOUT CONTRAST TECHNIQUE: Contiguous axial images were obtained from the base of the skull through the vertex without intravenous contrast. COMPARISON:  None. FINDINGS: Brain: There is no mass, hemorrhage or extra-axial collection. The size and configuration of the ventricles and extra-axial CSF spaces are normal. There is hypoattenuation  of the periventricular white matter, most commonly indicating chronic ischemic microangiopathy. There is an area of acute or early subacute ischemia within the anterior left  MCA distribution, involving the left frontal operculum. 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 Vascular: No abnormal hyperdensity of the major intracranial arteries or dural venous sinuses. No intracranial atherosclerosis. Skull: The visualized skull base, calvarium and extracranial soft tissues are normal. Sinuses/Orbits: Moderate left maxillary sinus mucosal thickening. The orbits are normal. ASPECTS [facility] Stroke Program Early CT Score) - Ganglionic level infarction (caudate, lentiform nuclei, internal capsule, insula, M1-M3 cortex): 6 - Supraganglionic infarction (M4-M6 cortex): 2 Total score (0-10 with 10 being normal): 8 IMPRESSION: 1. 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 2. No hemorrhage. 3. 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 4. ASPECTS is 8. 5. These results were communicated to Dr. Caryl Pina at 12:10 am on 12/19/2019 by text page via the [facility] messaging system. Electronically Signed   By: Deatra Robinson M.D.   On: 12/19/2019 00:12        Scheduled Meds:  aspirin EC  325 mg Oral Daily   atorvastatin  40 mg Oral q1800   chlorhexidine  15 mL Mouth Rinse BID   enoxaparin (LOVENOX) injection  40 mg Subcutaneous Q24H   mouth rinse  15 mL Mouth Rinse BID   Continuous Infusions:  sodium chloride 50 mL/hr at 12/20/19 1017     LOS: 1 day     Jacquelin Hawking, MD [facility] 12/20/2019, 1:11 PM  If 7PM-7AM, please contact night-coverage [email or web address]

## 2019-12-20 NOTE — Progress Notes (Signed)
STROKE TEAM PROGRESS NOTE   SUBJECTIVE (INTERVAL HISTORY) Dr. Berline Chough is also at bedside. Pt awake alert and aphasia much improved overnight, able to follow commands and answer questions with short answers. Moving all extremities. Pending CIR. Dr. Berline Chough recommend amantidine for the aphasia.   OBJECTIVE Temp:  [97.8 F (36.6 C)-98.5 F (36.9 C)] 97.8 F (36.6 C) (12/29 1211) Pulse Rate:  [61-82] 64 (12/29 0358) Cardiac Rhythm: Sinus bradycardia;Bundle branch block (12/29 0700) Resp:  [18-30] 18 (12/29 0358) BP: (117-167)/(59-80) 141/78 (12/29 0358) SpO2:  [96 %-100 %] 98 % (12/29 0358)  Recent Labs  Lab 12/18/19 2350  GLUCAP 100*   Recent Labs  Lab 12/18/19 2351 12/18/19 2356  NA 136 136  K 4.5 4.4  CL 103 104  CO2 21*  --   GLUCOSE 103* 99  BUN 28* 31*  CREATININE 1.24 1.10  CALCIUM 9.1  --    Recent Labs  Lab 12/18/19 2351  AST 16  ALT 19  ALKPHOS 50  BILITOT 0.7  PROT 6.5  ALBUMIN 3.8   Recent Labs  Lab 12/18/19 2351 12/18/19 2356  WBC 7.5  --   NEUTROABS 4.2  --   HGB 16.9 17.3*  HCT 51.7 51.0  MCV 93.2  --   PLT 169  --    No results for input(s): CKTOTAL, CKMB, CKMBINDEX, TROPONINI in the last 168 hours. Recent Labs    12/18/19 2351  LABPROT 12.5  INR 1.0   No results for input(s): COLORURINE, LABSPEC, PHURINE, GLUCOSEU, HGBUR, BILIRUBINUR, KETONESUR, PROTEINUR, UROBILINOGEN, NITRITE, LEUKOCYTESUR in the last 72 hours.  Invalid input(s): APPERANCEUR     Component Value Date/Time   CHOL 179 12/19/2019 0332   TRIG 87 12/19/2019 0332   HDL 50 12/19/2019 0332   CHOLHDL 3.6 12/19/2019 0332   VLDL 17 12/19/2019 0332   LDLCALC 112 (H) 12/19/2019 0332   Lab Results  Component Value Date   HGBA1C 6.2 (H) 12/19/2019   No results found for: LABOPIA, COCAINSCRNUR, LABBENZ, AMPHETMU, THCU, LABBARB  No results for input(s): ETH in the last 168 hours.  I have personally reviewed the radiological images below and agree with the radiology  interpretations.  CT Code Stroke CTA Head W/WO contrast  Result Date: 12/19/2019 CLINICAL DATA:  Aphasia EXAM: CT ANGIOGRAPHY HEAD AND NECK CT PERFUSION BRAIN TECHNIQUE: Multidetector CT imaging of the head and neck was performed using the standard protocol during bolus administration of intravenous contrast. Multiplanar CT image reconstructions and MIPs were obtained to evaluate the vascular anatomy. Carotid stenosis measurements (when applicable) are obtained utilizing NASCET criteria, using the distal internal carotid diameter as the denominator. Multiphase CT imaging of the brain was performed following IV bolus contrast injection. Subsequent parametric perfusion maps were calculated using RAPID software. CONTRAST:  OMNIPAQUE IOHEXOL 350 MG/ML SOLN COMPARISON:  None. FINDINGS: CTA NECK FINDINGS SKELETON: There is no bony spinal canal stenosis. No lytic or blastic lesion. OTHER NECK: Normal pharynx, larynx and major salivary glands. No cervical lymphadenopathy. Unremarkable thyroid gland. UPPER CHEST: No pneumothorax or pleural effusion. No nodules or masses. AORTIC ARCH: There is no calcific atherosclerosis of the aortic arch. There is no aneurysm, dissection or hemodynamically significant stenosis of the visualized portion of the aorta. Conventional 3 vessel aortic branching pattern. The visualized proximal subclavian arteries are widely patent. RIGHT CAROTID SYSTEM: No dissection, occlusion or aneurysm. Mild atherosclerotic calcification at the carotid bifurcation without hemodynamically significant stenosis. LEFT CAROTID SYSTEM: No dissection, occlusion or aneurysm. Mild atherosclerotic calcification at the  carotid bifurcation without hemodynamically significant stenosis. VERTEBRAL ARTERIES: Left dominant configuration. Moderate calcification of the left vertebral artery origin. There is no dissection, occlusion or flow-limiting stenosis to the skull base (V1-V3 segments). CTA HEAD FINDINGS  POSTERIOR CIRCULATION: --Vertebral arteries: Bilateral atherosclerotic calcification of the V4 segments with severe stenosis on the right. No stenosis on the left. --Posterior inferior cerebellar arteries (PICA): Patent origins from the vertebral arteries. --Anterior inferior cerebellar arteries (AICA): Patent origins from the basilar artery. --Basilar artery: Normal. --Superior cerebellar arteries: Normal. --Posterior cerebral arteries: Normal. Both originate from the basilar artery. Posterior communicating arteries (p-comm) are diminutive or absent. ANTERIOR CIRCULATION: --Intracranial internal carotid arteries: Atherosclerotic calcification of the internal carotid arteries at the skull base without hemodynamically significant stenosis. --Anterior cerebral arteries (ACA): Normal. Both A1 segments are present. Patent anterior communicating artery (a-comm). --Middle cerebral arteries (MCA): There is moderate stenosis of the distal right MCA M1 segment. There is no proximal occlusion. There is mild narrowing of the distal left M1 segment. The M2 branches are patent. There is tapering of an M3 branch in the area of the frontal operculum infarct. VENOUS SINUSES: As permitted by contrast timing, patent. ANATOMIC VARIANTS: None Review of the MIP images confirms the above findings. CT Brain Perfusion Findings: ASPECTS: 8 CBF (<30%) Volume: 11mL Perfusion (Tmax>6.0s) volume: 36mL Mismatch Volume: 25mL Infarction Location:Left frontal operculum IMPRESSION: 1. No emergent large vessel occlusion. 2. 11 mL core infarct in the left frontal operculum with surrounding 25 mL ischemic penumbra. 3. Mild narrowing of the distal left M1 segment with tapering of an M3 branch in the area of the frontal operculum infarct. 4. 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 5. Mild bilateral carotid bifurcation atherosclerosis without hemodynamically significant stenosis. 6. Moderate stenosis of the distal right MCA M1  segment. These results were called by telephone at the time of interpretation on 12/19/2019 at 12:30 am to [facility] , who verbally acknowledged these results. Electronically Signed   By: Deatra RobinsonKevin  Herman M.D.   On: 12/19/2019 00:35   DG Chest 2 View  Result Date: 12/19/2019 CLINICAL DATA:  Infarct EXAM: CHEST - 2 VIEW COMPARISON:  None. FINDINGS: Mild cardiomegaly and remote median sternotomy. No focal airspace consolidation or pulmonary edema. No pleural effusion or pneumothorax. IMPRESSION: No active cardiopulmonary disease. Electronically Signed   By: Deatra RobinsonKevin  Herman M.D.   On: 12/19/2019 03:00   CT Code Stroke CTA Neck W/WO contrast  Result Date: 12/19/2019 CLINICAL DATA:  Aphasia EXAM: CT ANGIOGRAPHY HEAD AND NECK CT PERFUSION BRAIN TECHNIQUE: Multidetector CT imaging of the head and neck was performed using the standard protocol during bolus administration of intravenous contrast. Multiplanar CT image reconstructions and MIPs were obtained to evaluate the vascular anatomy. Carotid stenosis measurements (when applicable) are obtained utilizing NASCET criteria, using the distal internal carotid diameter as the denominator. Multiphase CT imaging of the brain was performed following IV bolus contrast injection. Subsequent parametric perfusion maps were calculated using RAPID software. CONTRAST:  100mL OMNIPAQUE IOHEXOL 350 MG/ML SOLN COMPARISON:  None. FINDINGS: CTA NECK FINDINGS SKELETON: There is no bony spinal canal stenosis. No lytic or blastic lesion. OTHER NECK: Normal pharynx, larynx and major salivary glands. No cervical lymphadenopathy. Unremarkable thyroid gland. UPPER CHEST: No pneumothorax or pleural effusion. No nodules or masses. AORTIC ARCH: There is no calcific atherosclerosis of the aortic arch. There is no aneurysm, dissection or hemodynamically significant stenosis of the visualized portion of the aorta. Conventional 3 vessel aortic branching pattern.  The visualized proximal  subclavian arteries are widely patent. RIGHT CAROTID SYSTEM: No dissection, occlusion or aneurysm. Mild atherosclerotic calcification at the carotid bifurcation without hemodynamically significant stenosis. LEFT CAROTID SYSTEM: No dissection, occlusion or aneurysm. Mild atherosclerotic calcification at the carotid bifurcation without hemodynamically significant stenosis. VERTEBRAL ARTERIES: Left dominant configuration. Moderate calcification of the left vertebral artery origin. There is no dissection, occlusion or flow-limiting stenosis to the skull base (V1-V3 segments). CTA HEAD FINDINGS POSTERIOR CIRCULATION: --Vertebral arteries: Bilateral atherosclerotic calcification of the V4 segments with severe stenosis on the right. No stenosis on the left. --Posterior inferior cerebellar arteries (PICA): Patent origins from the vertebral arteries. --Anterior inferior cerebellar arteries (AICA): Patent origins from the basilar artery. --Basilar artery: Normal. --Superior cerebellar arteries: Normal. --Posterior cerebral arteries: Normal. Both originate from the basilar artery. Posterior communicating arteries (p-comm) are diminutive or absent. ANTERIOR CIRCULATION: --Intracranial internal carotid arteries: Atherosclerotic calcification of the internal carotid arteries at the skull base without hemodynamically significant stenosis. --Anterior cerebral arteries (ACA): Normal. Both A1 segments are present. Patent anterior communicating artery (a-comm). --Middle cerebral arteries (MCA): There is moderate stenosis of the distal right MCA M1 segment. There is no proximal occlusion. There is mild narrowing of the distal left M1 segment. The M2 branches are patent. There is tapering of an M3 branch in the area of the frontal operculum infarct. VENOUS SINUSES: As permitted by contrast timing, patent. ANATOMIC VARIANTS: None Review of the MIP images confirms the above findings. CT Brain Perfusion Findings: ASPECTS: 8 CBF (<30%)  Volume: 11mL Perfusion (Tmax>6.0s) volume: 36mL Mismatch Volume: 25mL Infarction Location:Left frontal operculum IMPRESSION: 1. No emergent large vessel occlusion. 2. 11 mL core infarct in the left frontal operculum with surrounding 25 mL ischemic penumbra. 3. Mild narrowing of the distal left M1 segment with tapering of an M3 branch in the area of the frontal operculum infarct. 4. 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 5. Mild bilateral carotid bifurcation atherosclerosis without hemodynamically significant stenosis. 6. Moderate stenosis of the distal right MCA M1 segment. These results were called by telephone at the time of interpretation on 12/19/2019 at 12:30 am to provider ERIC [facility] , who verbally acknowledged these results. Electronically Signed   By: Deatra Robinson M.D.   On: 12/19/2019 00:35   MR BRAIN WO CONTRAST  Result Date: 12/19/2019 CLINICAL DATA:  Acute onset of aphasia. EXAM: MRI HEAD WITHOUT CONTRAST TECHNIQUE: Multiplanar, multiecho pulse sequences of the brain and surrounding structures were obtained without intravenous contrast. COMPARISON:  Head CT 12/19/2019 FINDINGS: BRAIN: Intermediate sized acute infarct of the left frontal operculum. Early confluent hyperintense T2-weighted signal of the periventricular and deep white matter, most commonly due to chronic ischemic microangiopathy. 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 The cerebral and cerebellar volume are age-appropriate. There is no hydrocephalus. The midline structures are normal. VASCULAR: The major intracranial arterial and venous sinus flow voids are normal. Hemosiderin deposition at the site of old left parietal infarct. No acute hemorrhage. SKULL AND UPPER CERVICAL SPINE: Calvarial bone marrow signal is normal. There is no skull base mass. The visualized upper cervical spine and soft tissues are normal. SINUSES/ORBITS: Mild left maxillary sinus mucosal thickening. The orbits  are normal. IMPRESSION: 1. Intermediate sized acute infarct of the left frontal operculum. No hemorrhage or mass effect. 2. 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 3. Moderate chronic small vessel disease. Electronically Signed   By: Deatra Robinson M.D.   On: 12/19/2019 02:49  CT Code Stroke Cerebral Perfusion with contrast  Result Date: 12/19/2019 CLINICAL DATA:  Aphasia EXAM: CT ANGIOGRAPHY HEAD AND NECK CT PERFUSION BRAIN TECHNIQUE: Multidetector CT imaging of the head and neck was performed using the standard protocol during bolus administration of intravenous contrast. Multiplanar CT image reconstructions and MIPs were obtained to evaluate the vascular anatomy. Carotid stenosis measurements (when applicable) are obtained utilizing NASCET criteria, using the distal internal carotid diameter as the denominator. Multiphase CT imaging of the brain was performed following IV bolus contrast injection. Subsequent parametric perfusion maps were calculated using RAPID software. CONTRAST:  OMNIPAQUE IOHEXOL 350 MG/ML SOLN COMPARISON:  None. FINDINGS: CTA NECK FINDINGS SKELETON: There is no bony spinal canal stenosis. No lytic or blastic lesion. OTHER NECK: Normal pharynx, larynx and major salivary glands. No cervical lymphadenopathy. Unremarkable thyroid gland. UPPER CHEST: No pneumothorax or pleural effusion. No nodules or masses. AORTIC ARCH: There is no calcific atherosclerosis of the aortic arch. There is no aneurysm, dissection or hemodynamically significant stenosis of the visualized portion of the aorta. Conventional 3 vessel aortic branching pattern. The visualized proximal subclavian arteries are widely patent. RIGHT CAROTID SYSTEM: No dissection, occlusion or aneurysm. Mild atherosclerotic calcification at the carotid bifurcation without hemodynamically significant stenosis. LEFT CAROTID SYSTEM: No dissection, occlusion or aneurysm. Mild atherosclerotic calcification at the carotid  bifurcation without hemodynamically significant stenosis. VERTEBRAL ARTERIES: Left dominant configuration. Moderate calcification of the left vertebral artery origin. There is no dissection, occlusion or flow-limiting stenosis to the skull base (V1-V3 segments). CTA HEAD FINDINGS POSTERIOR CIRCULATION: --Vertebral arteries: Bilateral atherosclerotic calcification of the V4 segments with severe stenosis on the right. No stenosis on the left. --Posterior inferior cerebellar arteries (PICA): Patent origins from the vertebral arteries. --Anterior inferior cerebellar arteries (AICA): Patent origins from the basilar artery. --Basilar artery: Normal. --Superior cerebellar arteries: Normal. --Posterior cerebral arteries: Normal. Both originate from the basilar artery. Posterior communicating arteries (p-comm) are diminutive or absent. ANTERIOR CIRCULATION: --Intracranial internal carotid arteries: Atherosclerotic calcification of the internal carotid arteries at the skull base without hemodynamically significant stenosis. --Anterior cerebral arteries (ACA): Normal. Both A1 segments are present. Patent anterior communicating artery (a-comm). --Middle cerebral arteries (MCA): There is moderate stenosis of the distal right MCA M1 segment. There is no proximal occlusion. There is mild narrowing of the distal left M1 segment. The M2 branches are patent. There is tapering of an M3 branch in the area of the frontal operculum infarct. VENOUS SINUSES: As permitted by contrast timing, patent. ANATOMIC VARIANTS: None Review of the MIP images confirms the above findings. CT Brain Perfusion Findings: ASPECTS: 8 CBF (<30%) Volume: 11mL Perfusion (Tmax>6.0s) volume: 36mL Mismatch Volume: 25mL Infarction Location:Left frontal operculum IMPRESSION: 1. No emergent large vessel occlusion. 2. 11 mL core infarct in the left frontal operculum with surrounding 25 mL ischemic penumbra. 3. Mild narrowing of the distal left M1 segment with tapering  of an M3 branch in the area of the frontal operculum infarct. 4. 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 5. Mild bilateral carotid bifurcation atherosclerosis without hemodynamically significant stenosis. 6. Moderate stenosis of the distal right MCA M1 segment. These results were called by telephone at the time of interpretation on 12/19/2019 at 12:30 am to provider ERIC [facility] , who verbally acknowledged these results. Electronically Signed   By: Deatra Robinson M.D.   On: 12/19/2019 00:35   ECHOCARDIOGRAM COMPLETE  Result Date: 12/19/2019   ECHOCARDIOGRAM REPORT   Patient Name:   Raymond Vance Date of  Exam: 12/19/2019 Medical Rec #:  [id number]    Height:       71.0 in Accession #:    [id number]   Weight:       215.6 lb Date of Birth:  1941-05-09    BSA:          2.18 m Patient Age:    78 years     BP:           118/64 mmHg Patient Gender: M            HR:           37 bpm. Exam Location:  Inpatient Procedure: 2D Echo, Cardiac Doppler, Color Doppler and Intracardiac            Opacification Agent Indications:    Stroke  History:        Patient has no prior history of Echocardiogram examinations. CAD                 and Previous Myocardial Infarction, Prior CABG and Abnormal ECG,                 Stroke and COPD; Risk Factors:Hypertension.  Sonographer:    Sheralyn Boatman RDCS Referring Phys: [phone] JARED M GARDNER  Sonographer Comments: Technically difficult study due to poor echo windows, no subcostal window, suboptimal subcostal window and suboptimal apical window. IMPRESSIONS  1. Left ventricular ejection fraction, by visual estimation, is 20 to 25%. The left ventricle has severely decreased function. There is moderately increased left ventricular hypertrophy.  2. Definity contrast agent was given IV to delineate the left ventricular endocardial borders. No mural thrombus was noted.  3. Left ventricular diastolic function could not be evaluated.  4. The left ventricle demonstrates global  hypokinesis.  5. Global right ventricle has normal systolic function.The right ventricular size is normal. No increase in right ventricular wall thickness.  6. Left atrial size was moderately dilated.  7. Right atrial size was mildly dilated.  8. The mitral valve is grossly normal. Trivial mitral valve regurgitation.  9. The tricuspid valve is grossly normal. 10. The aortic valve is tricuspid. Aortic valve regurgitation is trivial. Mild aortic valve sclerosis without stenosis. 11. The pulmonic valve was not well visualized. Pulmonic valve regurgitation is not visualized. 12. Aortic dilatation noted. 13. 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 FINDINGS  Left Ventricle: Left ventricular ejection fraction, by visual estimation, is 20 to 25%. The left ventricle has severely decreased function. Definity contrast agent was given IV to delineate the left ventricular endocardial borders. The left ventricle demonstrates global hypokinesis. There is moderately increased left ventricular hypertrophy. The left ventricular diastology could not be evaluated due to indeterminate diastolic function. Left ventricular diastolic function could not be evaluated. Right Ventricle: The right ventricular size is normal. No increase in right ventricular wall thickness. Global RV systolic function is has normal systolic function. Left Atrium: Left atrial size was moderately dilated. Right Atrium: Right atrial size was mildly dilated Pericardium: There is no evidence of pericardial effusion. Mitral Valve: The mitral valve is grossly normal. There is mild thickening of the mitral valve leaflet(s). Trivial mitral valve regurgitation. Tricuspid Valve: The tricuspid valve is grossly normal. Tricuspid valve regurgitation is trivial. Aortic Valve: The aortic valve is tricuspid. Aortic valve regurgitation is trivial. Mild aortic valve sclerosis is present, with no evidence of aortic valve stenosis. Aortic valve mean gradient  measures 7.5 mmHg. Aortic valve peak gradient measures 15.4 mmHg. Aortic  valve area, by VTI measures 4.38 cm. Pulmonic Valve: The pulmonic valve was not well visualized. Pulmonic valve regurgitation is not visualized. Pulmonic regurgitation is not visualized. Aorta: Aortic dilatation noted. 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 Venous: The inferior vena cava was not well visualized. IAS/Shunts: No atrial level shunt detected by color flow Doppler.  LEFT VENTRICLE PLAX 2D LVIDd:         6.60 cm LVIDs:         5.90 cm LV PW:         1.50 cm LV IVS:        1.60 cm LVOT diam:     2.70 cm LV SV:         50 ml LV SV Index:   22.53 LVOT Area:     5.73 cm  LV Volumes (MOD) LV area d, A4C:    52.90 cm LV area s, A4C:    44.30 cm LV major d, A4C:   9.50 cm LV major s, A4C:   8.62 cm LV vol d, MOD A4C: 226.0 ml LV vol s, MOD A4C: 175.0 ml LV SV MOD A4C:     226.0 ml RIGHT VENTRICLE         IVC TAPSE (M-mode): 1.7 cm  IVC diam: 2.70 cm LEFT ATRIUM              Index       RIGHT ATRIUM           Index LA diam:        4.10 cm  1.88 cm/m  RA Area:     23.60 cm LA Vol (A2C):   104.0 ml 47.77 ml/m RA Volume:   77.40 ml  35.55 ml/m LA Vol (A4C):   71.6 ml  32.89 ml/m LA Biplane Vol: 90.4 ml  41.53 ml/m  AORTIC VALVE AV Area (Vmax):    4.27 cm AV Area (Vmean):   4.53 cm AV Area (VTI):     4.38 cm AV Vmax:           196.50 cm/s AV Vmean:          125.000 cm/s AV VTI:            0.412 m AV Peak Grad:      15.4 mmHg AV Mean Grad:      7.5 mmHg LVOT Vmax:         146.50 cm/s LVOT Vmean:        98.950 cm/s LVOT VTI:          0.316 m LVOT/AV VTI ratio: 0.77  AORTA Ao Root diam: 3.60 cm Ao Asc diam:  4.00 cm MITRAL VALVE MV Area (PHT): 4.06 cm             SHUNTS MV PHT:        54.13 msec           Systemic VTI:  0.32 m MV Decel Time: 187 msec             Systemic Diam: 2.70 cm MV E velocity: 85.87 cm/s 103 cm/s MV A velocity: 83.90 cm/s 70.3 cm/s MV E/A ratio:  1.02       1.5  Zoila Shutter MD  Electronically signed by Zoila Shutter MD Signature Date/Time: 12/19/2019/12:00:27 PM    Final    CT HEAD CODE STROKE WO CONTRAST  Result Date: 12/19/2019 CLINICAL DATA:  Code stroke.  Confusion and aphasia EXAM: CT HEAD  WITHOUT CONTRAST TECHNIQUE: Contiguous axial images were obtained from the base of the skull through the vertex without intravenous contrast. COMPARISON:  None. FINDINGS: Brain: There is no mass, hemorrhage or extra-axial collection. The size and configuration of the ventricles and extra-axial CSF spaces are normal. There is hypoattenuation of the periventricular white matter, most commonly indicating chronic ischemic microangiopathy. There is an area of acute or early subacute ischemia within the anterior left MCA distribution, involving the left frontal operculum. 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 Vascular: No abnormal hyperdensity of the major intracranial arteries or dural venous sinuses. No intracranial atherosclerosis. Skull: The visualized skull base, calvarium and extracranial soft tissues are normal. Sinuses/Orbits: Moderate left maxillary sinus mucosal thickening. The orbits are normal. ASPECTS [facility] Stroke Program Early CT Score) - Ganglionic level infarction (caudate, lentiform nuclei, internal capsule, insula, M1-M3 cortex): 6 - Supraganglionic infarction (M4-M6 cortex): 2 Total score (0-10 with 10 being normal): 8 IMPRESSION: 1. 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 2. No hemorrhage. 3. 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 4. ASPECTS is 8. 5. These results were communicated to Dr. Kerney Elbe at 12:10 am on 12/19/2019 by text page via the [facility] messaging system. Electronically Signed   By: Ulyses Jarred M.D.   On: 12/19/2019 00:12    PHYSICAL EXAM   Temp:  [97.8 F (36.6 C)-98.5 F (36.9 C)] 97.8 F (36.6 C) (12/29 1211) Pulse Rate:  [61-82] 64  (12/29 0358) Resp:  [18-30] 18 (12/29 0358) BP: (117-167)/(59-80) 141/78 (12/29 0358) SpO2:  [96 %-100 %] 98 % (12/29 0358)  General - Well nourished, well developed, in no apparent distress.  Ophthalmologic - fundi not visualized due to noncooperation.  Cardiovascular - frequent PACs and PVCs on tele.  Neuro - awake alert, eyes open, attending to both sides, no gaze deviation, PERRL. Expressive aphasia improved, able to answer questions with simple words, orientated to self, time, place and situation but not to age. Able to follow simple central and peripheral commands with intermittent perseveration. Facial symmetrical, blinking to visual threat bilaterally, tongue midline. Moving all extremities equally and against gravity. Right index finger amputated but chronic. DTR 1+ and no babinski. Sensation symmetrical, coordination FTN bilaterally intact and gait not tested.   ASSESSMENT/PLAN Raymond Vance is a 78 y.o. male with history of HTN, MI, CABG, COPD, cataract surgery 2 weeks ago who developed acute onset aphasia, mute on arrival. No tPA given due to being outside the window.   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 mult old infarcts. ASPECTS 8.  CTA head & neck no LVO. Mild narrowing distal L M2 w/ tapering L M3. Severe stenosis R V4. Moderate stenosis R M1.  Bilateral siphon stenosis right more than left.  Bilateral VA origin stenosis.  CT perfusion 11 mL L frontal operculum core infarct w/ 25 mL penumbra  MRI  L frontal operculum infarct. Old R cerebellar and L parietal lobe infarcts. Moderate small vessel disease.  2D Echo  EF 20-25%. Moderate LVH. R & L atria dilatation. No source of embolus   LDL 112  HgbA1c 6.2  Lovenox 40 mg sq daily for VTE prophylaxis  No antithrombotic prior to admission, now on aspirin 325 mg daily followed by 600 mg load. 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 Stable . Gradually normalized within 3-5 days.  Long term BP goal normotensive  Hyperlipidemia  Home meds:  No statin   LDL 112, goal < 70  On Lipitor 40 now  Continue statin at discharge  Other Stroke Risk Factors  Advanced age  Probable Cigarette smoker, smoking cessation will be provided once appropriate  Obesity, Body mass index is 30.07 kg/m.   CAD/MI status post CABG  Other Active Problems  Cataract surgery 2 weeks ago  COPD  Hospital day # 1  Neurology will sign off. Please call with questions. Pt will follow up with stroke clinic NP at [facility] in about 4 weeks. Thanks for the consult.   Marvel Plan, MD PhD Stroke Neurology 12/20/2019 2:59 PM    To contact Stroke Continuity provider, please refer to [email or web address]. After hours, contact General Neurology

## 2019-12-20 NOTE — Progress Notes (Signed)
Physical Therapy Treatment Patient Details Name: Raymond Vance MRN: [id number] DOB: February 11, 1941 Today's Date: 12/20/2019    History of Present Illness  Raymond Vance is a 78 y.o. male with medical history significant of HTN, CAD s/p CABG.  Patient presents to ED via EMS with acute onset of aphasia. MRI showed acute CVA of the frontal operculum as well as past L parietal and R cerebellum.     PT Comments    Pt tolerated mobility better from a HR standpoint today. Ambulated 160' with RW and short distance without AD with min/ min-guard A. Needed 2 standing and one seated rest break. HR as high as 127bpm but mostly maintained in 110's. Pt attempting more verbalization today, able to say yes and no as well as a few partial sentences. PT will continue to follow.    Follow Up Recommendations  CIR     Equipment Recommendations  None recommended by PT    Recommendations for Other Services Rehab consult;Speech consult     Precautions / Restrictions Precautions Precautions: Fall Precaution Comments: watch HR  Restrictions Weight Bearing Restrictions: No    Mobility  Bed Mobility Overal bed mobility: Needs Assistance Bed Mobility: Supine to Sit     Supine to sit: Supervision     General bed mobility comments: pt sat up with use of rail without difficulty  Transfers Overall transfer level: Needs assistance Equipment used: Rolling walker (2 wheeled);None Transfers: Sit to/from Stand Sit to Stand: Supervision         General transfer comment: supervision from bed, recliner, and low toilet  Ambulation/Gait Ambulation/Gait assistance: Min assist Gait Distance (Feet): 160 Feet Assistive device: Rolling walker (2 wheeled) Gait Pattern/deviations: Step-through pattern;Decreased stride length Gait velocity: decreased Gait velocity interpretation: <1.31 ft/sec, indicative of household ambulator General Gait Details: shortened step length with toe out for widened base, min A  without RW, min-guard A with RW. Took 2 standing rest breaks for HR to decrease, highest seen today was 127 bpm.    Stairs             Wheelchair Mobility    Modified Rankin (Stroke Patients Only) Modified Rankin (Stroke Patients Only) Pre-Morbid Rankin Score: No symptoms Modified Rankin: Moderately severe disability     Balance Overall balance assessment: Needs assistance Sitting-balance support: No upper extremity supported;Feet supported Sitting balance-Leahy Scale: Fair     Standing balance support: Bilateral upper extremity supported Standing balance-Leahy Scale: Fair Standing balance comment: able to maintain static standing without support. Had explosive diarrhea in bathroom, stood with and without support for prolonged time for clean up.                             Cognition Arousal/Alertness: Awake/alert Behavior During Therapy: WFL for tasks assessed/performed Overall Cognitive Status: Difficult to assess                                 General Comments: pt nodding head yes and no, unsure accuracy of answers      Exercises      General Comments General comments (skin integrity, edema, etc.): able to give more confident answers to home questions as long as they were yes and no. His wife is independent and drives, he drove before this. He has a few steps to get into home and home is one level. He has a tub with shower head. He  did not ambulate with an AD PTA. He was able to say both yes and no today and several partial sentences.       Pertinent Vitals/Pain Pain Assessment: No/denies pain    Home Living                      Prior Function            PT Goals (current goals can now be found in the care plan section) Acute Rehab PT Goals Patient Stated Goal: unable to state PT Goal Formulation: With patient Time For Goal Achievement: 01/02/20 Potential to Achieve Goals: Good Progress towards PT goals: Progressing  toward goals    Frequency    Min 4X/week      PT Plan Current plan remains appropriate    Vance-evaluation              AM-PAC PT "6 Clicks" Mobility   Outcome Measure  Help needed turning from your back to your side while in a flat bed without using bedrails?: A Little Help needed moving from lying on your back to sitting on the side of a flat bed without using bedrails?: A Little Help needed moving to and from a bed to a chair (including a wheelchair)?: A Little Help needed standing up from a chair using your arms (e.g., wheelchair or bedside chair)?: A Little Help needed to walk in hospital room?: A Lot Help needed climbing 3-5 steps with a railing? : Total 6 Click Score: 15    End of Session Equipment Utilized During Treatment: Gait belt Activity Tolerance: Patient tolerated treatment well Patient left: with call bell/phone within reach;in chair;with chair alarm set Nurse Communication: Mobility status PT Visit Diagnosis: Unsteadiness on feet (R26.81);Difficulty in walking, not elsewhere classified (R26.2)     Time: 1610-9604 PT Time Calculation (min) (ACUTE ONLY): 42 min  Charges:  $Gait Training: 23-37 mins $Therapeutic Activity: 8-22 mins                     Raymond Vance, PT  Acute Rehab Services  Pager [phone] Office [phone]    Raymond Vance 12/20/2019, 1:39 PM

## 2019-12-21 DIAGNOSIS — I255 Ischemic cardiomyopathy: Secondary | ICD-10-CM

## 2019-12-21 DIAGNOSIS — I2510 Atherosclerotic heart disease of native coronary artery without angina pectoris: Secondary | ICD-10-CM

## 2019-12-21 MED ORDER — APIXABAN 5 MG PO TABS: 5.0000 mg | ORAL_TABLET | Freq: Two times a day (BID) | ORAL | Status: DC

## 2019-12-21 MED ORDER — APIXABAN 5 MG PO TABS: 5.0000 mg | ORAL_TABLET | Freq: Two times a day (BID) | ORAL | 2 refills | Status: DC

## 2019-12-21 NOTE — Evaluation (Addendum)
Speech Language Pathology Evaluation Patient Details Name: Raymond Vance MRN: [id number] DOB: 09-14-1941 Today's Date: 12/21/2019 Time: 1001-1039 SLP Time Calculation (min) (ACUTE ONLY): 38 min  Problem List:  Patient Active Problem List   Diagnosis Date Noted  . Acute ischemic stroke ([place]) 12/19/2019  . HTN (hypertension) 12/19/2019  . CAD (coronary artery disease) 12/19/2019  . Severe aphasia 12/19/2019   Past Medical History:  Past Medical History:  Diagnosis Date  . COPD (chronic obstructive pulmonary disease) ([place])   . Hypertension   . STEMI (ST elevation myocardial infarction) [facility])    Past Surgical History:  Past Surgical History:  Procedure Laterality Date  . CATARACT EXTRACTION    . CORONARY ARTERY BYPASS GRAFT     HPI:      Assessment / Plan / Recommendation Clinical Impression  Patient seen at bedside for evaluation of speech and language. Patient presents with aphasia, expressive > receptive communication deficits in the setting of recent CVA. Patient was assessed using the Western Aphasia Battery - Bedside Form (see below for more information).  Patient presents with word-retrieval difficulty, but benefits from visual cues and semantic and phonemic cueing. Speech is somewhat halting and he demonstrates difficulty with spontaneous speech. Pt primarily using 1-2 word phrases and there was one instance of phonemic paraphasia.   Patient's auditory comprehenion is quite good and he demonstrated the ability to read at the sentence level. Reading not fully assessed as patient did have his eyeglasses. The patient exhibited some difficulty with complex multi-step verbal directions.   No dysarthria noted, intelligibility clear. Informally, patient was able to attend to all tasks and demonstrated insight and awareness of errors.   Pt would benefit from ongoing speech therapy to address the deficits noted above, and will benefit from speech therapy at the next level of  care.   Western Apahasia Battery (WAB) Bedside Record Form Bedside Aphasia Score: 65/100  Spontaneous speech: content: 3/10 Spontaneous speech: fluency 5/10 Auditory verbal comprehension: 9/10 Sequential commands: 6/10 Repetition: 9/10 Object naming: 7/10    SLP Assessment  SLP Recommendation/Assessment: Patient needs continued Speech Lanaguage Pathology Services SLP Visit Diagnosis: Aphasia (R47.01)    Follow Up Recommendations  Inpatient Rehab;Home health SLP    Frequency and Duration min 2x/week  2 weeks      SLP Evaluation Cognition  Overall Cognitive Status: No family/caregiver present to determine baseline cognitive functioning Arousal/Alertness: Awake/alert Orientation Level: Oriented to person;Oriented to situation;Oriented to time Attention: Focused Focused Attention: Appears intact Awareness: Appears intact       Comprehension  Auditory Comprehension Overall Auditory Comprehension: Appears within functional limits for tasks assessed Yes/No Questions: Within Functional Limits Commands: Impaired One Step Basic Commands: 75-100% accurate Multistep Basic Commands: 25-49% accurate Conversation: Simple EffectiveTechniques: Extra processing time;Pausing Reading Comprehension Reading Status: Unable to assess (comment)    Expression Expression Primary Mode of Expression: Verbal Verbal Expression Overall Verbal Expression: Impaired Initiation: Impaired Level of Generative/Spontaneous Verbalization: Phrase;Word Repetition: No impairment Naming: Impairment Responsive: 51-75% accurate Confrontation: Impaired Convergent: 50-74% accurate Verbal Errors: Phonemic paraphasias;Aware of errors Non-Verbal Means of Communication: Gestures Written Expression Written Expression: Not tested   Oral / Motor      [place], M.Ed., CCC-SLP Speech Therapy Acute Rehabilitation 12/21/2019, 11:03 AM

## 2019-12-21 NOTE — Progress Notes (Signed)
Inpatient Rehabilitation Admissions Coordinator  I contacted pt's wife by phone to clarify their preference for rehab venue. Pt's wife did visit him yesterday and he told her to bring his clothes. He is refusing CIR and wants to d/c home. Wife to visit him today and would like to speak to MD as well as Case manager on home therapies. I will alert RN CM, Vida Roller and MD.  Danne Baxter, RN, MSN Rehab Admissions Coordinator [phone] 12/21/2019 8:47 AM

## 2019-12-21 NOTE — Plan of Care (Signed)
Mod I for adls, remains aphasic

## 2019-12-21 NOTE — Progress Notes (Signed)
Physical Therapy Treatment Patient Details Name: Raymond Vance MRN: [id number] DOB: 1941/10/30 Today's Date: 12/21/2019    History of Present Illness  Raymond Vance is a 78 y.o. male with medical history significant of HTN, CAD s/p CABG.  Patient presents to ED via EMS with acute onset of aphasia. MRI showed acute CVA of the frontal operculum as well as past L parietal and R cerebellum.     PT Comments    Pt making steady progress towards physical therapy goals. Noted improved comprehension; although still displays expressive communication deficits and decreased awareness of safety/deficits. Pt ambulating 250 feet with no assistive device at a min assist level. HR/BP stable. Demonstrates dynamic balance deficits and gait abnormalities. Will benefit from walker for increased stability and independence. Declining CIR; updated to HHPT to maximize functional independence.     Follow Up Recommendations  Home health PT;Supervision/Assistance - 24 hour     Equipment Recommendations  Rolling walker with 5" wheels    Recommendations for Other Services       Precautions / Restrictions Precautions Precautions: Fall Restrictions Weight Bearing Restrictions: No    Mobility  Bed Mobility Overal bed mobility: Needs Assistance Bed Mobility: Supine to Sit     Supine to sit: Supervision        Transfers Overall transfer level: Needs assistance Equipment used: None Transfers: Sit to/from Stand Sit to Stand: Supervision         General transfer comment: Supervision to stand from bed and toilet  Ambulation/Gait Ambulation/Gait assistance: Min assist Gait Distance (Feet): 250 Feet Assistive device: None Gait Pattern/deviations: Step-through pattern;Decreased stride length;Wide base of support Gait velocity: decreased   General Gait Details: Up to min assist due to lateral LOB initially. Cues for pointing left toes forward, stopping to regain balance with instability, activity  pacing. Pt able to find rooms/signage without cues.   Stairs             Wheelchair Mobility    Modified Rankin (Stroke Patients Only) Modified Rankin (Stroke Patients Only) Pre-Morbid Rankin Score: No symptoms Modified Rankin: Moderately severe disability     Balance Overall balance assessment: Needs assistance Sitting-balance support: No upper extremity supported;Feet supported Sitting balance-Leahy Scale: Good     Standing balance support: No upper extremity supported;During functional activity Standing balance-Leahy Scale: Fair                              Cognition Arousal/Alertness: Awake/alert Behavior During Therapy: WFL for tasks assessed/performed Overall Cognitive Status: Impaired/Different from baseline Area of Impairment: Safety/judgement;Following commands                       Following Commands: Follows one step commands consistently Safety/Judgement: Decreased awareness of safety;Decreased awareness of deficits     General Comments: Pt nodding yes and no reliably, improved comprehension, difficulty with word finding. Decreased awareness of safety/deficits; pt indicating his balance is normal despite gross instability.      Exercises      General Comments        Pertinent Vitals/Pain Pain Assessment: Faces Faces Pain Scale: No hurt    Home Living     Available Help at Discharge: Family;Available 24 hours/day Type of Home: House              Prior Function            PT Goals (current goals can now be found in the  care plan section) Acute Rehab PT Goals Potential to Achieve Goals: Good Progress towards PT goals: Progressing toward goals    Frequency    Min 4X/week      PT Plan Current plan remains appropriate    Co-evaluation              AM-PAC PT "6 Clicks" Mobility   Outcome Measure  Help needed turning from your back to your side while in a flat bed without using bedrails?:  None Help needed moving from lying on your back to sitting on the side of a flat bed without using bedrails?: None Help needed moving to and from a bed to a chair (including a wheelchair)?: A Little Help needed standing up from a chair using your arms (e.g., wheelchair or bedside chair)?: None Help needed to walk in hospital room?: A Little Help needed climbing 3-5 steps with a railing? : A Lot 6 Click Score: 20    End of Session Equipment Utilized During Treatment: Gait belt Activity Tolerance: Patient tolerated treatment well Patient left: in chair;with call bell/phone within reach;with chair alarm set Nurse Communication: Mobility status PT Visit Diagnosis: Unsteadiness on feet (R26.81);Difficulty in walking, not elsewhere classified (R26.2)     Time: 6644-0347 PT Time Calculation (min) (ACUTE ONLY): 21 min  Charges:  $Gait Training: 8-22 mins                     Laurina Bustle, [place], DPT Acute Rehabilitation Services Pager [phone] Office [phone]    Vanetta Mulders 12/21/2019, 12:05 PM

## 2019-12-21 NOTE — Discharge Summary (Signed)
Physician Discharge Summary  Raymond Vance ZOX:[id number] DOB: 1941-01-10 DOA: 12/18/2019  PCP: Wilburn Mylar, MD  Admit date: 12/18/2019 Discharge date: 12/21/2019  Admitted From: Home Disposition:  Home  Recommendations for Outpatient Follow-up:  1. Follow up with PCP in 1-2 weeks 2. Please obtain BMP/CBC in one week 3. Follow-up with cardiology. 4. Follow-up with neurology. 5. Please follow up on the following pending results:None  Home Health: Yes Equipment/Devices: Rolling walker Discharge Condition: Stable CODE STATUS: Full Diet recommendation: Heart Healthy / Dysphagia   Brief/Interim Summary: Chirag Botts is a 78 y.o. malewith medical history significant ofHTN, CAD s/p CABG, chronic systolic heart failure. Patient presented secondary to aphasia and found to have an acute/subacute stroke involving the anterior left MCA area.  Acute CVA Resultant severe aphasia. CT head significant for acute/early subacute ischemia of anterior left MCA distribution. MRI significant for intermediate sized acute infarct of left frontal operculum.  There was some concern for embolic phenomena and neurology is recommending to start her on Eliquis. Patient was given full dose aspirin daily during hospitalization and discharged on aspirin 81 mg daily as he was also started on Eliquis. PT/OT initially recommending CIR-patient and wife wants to go home with home health services which were ordered. He will continue with Lipitor.   Essential hypertension Patient is on hydrochlorothiazide, lisinopril, spironolactone as an outpatient. Antihypertensive medications held in setting of acute stroke. Patient will resume his home meds on discharge except lisinopril and spironolactone as cardiology wants to start him on Entresto as an outpatient.  Chronic systolic heart failure On chart review, appears last EF was 33% from Transthoracic Echocardiogram in 2018. Per wife, patient has not followed with  cardiology in years. EF this admission of 20-25%. Patient is on lisinopril, metoprolol, spironolactone as an outpatient.  Cardiology was consulted and they are planning to start him on Entresto as an outpatient.  Lisinopril and spironolactone was discontinued on discharge.  He will need to follow-up with cardiology as an outpatient for further management.  He will continue with his inhalers for COPD-no current concern.  Discharge Diagnoses:  Principal Problem:   Acute ischemic stroke [facility]) Active Problems:   HTN (hypertension)   CAD (coronary artery disease)   Severe aphasia   Discharge Instructions  Discharge Instructions    Ambulatory referral to Neurology   Complete by: As directed    Follow up with stroke clinic NP (Jessica Vanschaick or Darrol Angel, if both not available, consider Manson Allan, or Ahern) at [facility] in about 4 weeks. Thanks.   Diet - low sodium heart healthy   Complete by: As directed    Discharge instructions   Complete by: As directed    It was pleasure taking care of you. We are holding your home dose of lisinopril and spironolactone according to your cardiologist recommendations, they want to start another medicine called Entresto for your heart failure which he required your blood pressure to be mildly elevated. Please follow-up with cardiology within 1 week for further recommendations and starting of your new medicine. Follow-up with neurology.   Increase activity slowly   Complete by: As directed      Allergies as of 12/21/2019      Reactions   Pneumococcal Vaccine    Other reaction(s): Cough (ALLERGY/intolerance), Other (See Comments)      Medication List    STOP taking these medications   lisinopril 40 MG tablet Commonly known as: ZESTRIL   spironolactone 25 MG tablet Commonly known as: ALDACTONE  TAKE these medications   apixaban 5 MG Tabs tablet Commonly known as: ELIQUIS Take 1 tablet (5 mg total) by mouth 2 (two) times daily.    aspirin 81 MG EC tablet Take 81 mg by mouth daily.   atorvastatin 40 MG tablet Commonly known as: LIPITOR Take 40 mg by mouth daily.   hydrochlorothiazide 25 MG tablet Commonly known as: HYDRODIURIL Take 25 mg by mouth daily.   metoprolol tartrate 50 MG tablet Commonly known as: LOPRESSOR Take 50 mg by mouth daily.   omeprazole 40 MG capsule Commonly known as: PRILOSEC Take 40 mg by mouth daily.   Symbicort 160-4.5 MCG/ACT inhaler Generic drug: budesonide-formoterol Inhale 2 puffs into the lungs 2 (two) times daily.   Ventolin HFA 108 (90 Base) MCG/ACT inhaler Generic drug: albuterol Inhale 2 puffs into the lungs every 6 (six) hours as needed for wheezing or shortness of breath.            Durable Medical Equipment  (From admission, onward)         Start     Ordered   12/21/19 1623  DME Walker  Once    Question Answer Comment  Walker: With 5 Inch Wheels   Patient needs a walker to treat with the following condition CVA (cerebral vascular accident) (HCC)      12/21/19 1624   12/21/19 1120  For home use only DME Walker rolling  Once    Question:  Patient needs a walker to treat with the following condition  Answer:  [facility])   12/21/19 1120         Follow-up Information    [facility]. Schedule an appointment as soon as possible for a visit in 4 week(s).   Specialty: Neurology Contact information: [address] [place] [address] [phone]       Revankar, Aundra Dubin, MD Follow up.   Specialty: Cardiology Why: Cardiology hospital follow up on 01/04/2020 at 1:15. Please arrive 15 minutes early for check in. Please call our office to reschedule if you are unable to keep this appointment.  Contact information: [address] [place] [address] [phone]          Allergies  Allergen Reactions  . Pneumococcal Vaccine     Other reaction(s): Cough (ALLERGY/intolerance), Other (See Comments)     Consultations:  Neurology  Cardiology  Procedures/Studies: CT Code Stroke CTA Head W/WO contrast  Result Date: 12/19/2019 CLINICAL DATA:  Aphasia EXAM: CT ANGIOGRAPHY HEAD AND NECK CT PERFUSION BRAIN TECHNIQUE: Multidetector CT imaging of the head and neck was performed using the standard protocol during bolus administration of intravenous contrast. Multiplanar CT image reconstructions and MIPs were obtained to evaluate the vascular anatomy. Carotid stenosis measurements (when applicable) are obtained utilizing NASCET criteria, using the distal internal carotid diameter as the denominator. Multiphase CT imaging of the brain was performed following IV bolus contrast injection. Subsequent parametric perfusion maps were calculated using RAPID software. CONTRAST:  OMNIPAQUE IOHEXOL 350 MG/ML SOLN COMPARISON:  None. FINDINGS: CTA NECK FINDINGS SKELETON: There is no bony spinal canal stenosis. No lytic or blastic lesion. OTHER NECK: Normal pharynx, larynx and major salivary glands. No cervical lymphadenopathy. Unremarkable thyroid gland. UPPER CHEST: No pneumothorax or pleural effusion. No nodules or masses. AORTIC ARCH: There is no calcific atherosclerosis of the aortic arch. There is no aneurysm, dissection or hemodynamically significant stenosis of the visualized portion of the aorta. Conventional 3 vessel aortic branching pattern. The visualized proximal subclavian  arteries are widely patent. RIGHT CAROTID SYSTEM: No dissection, occlusion or aneurysm. Mild atherosclerotic calcification at the carotid bifurcation without hemodynamically significant stenosis. LEFT CAROTID SYSTEM: No dissection, occlusion or aneurysm. Mild atherosclerotic calcification at the carotid bifurcation without hemodynamically significant stenosis. VERTEBRAL ARTERIES: Left dominant configuration. Moderate calcification of the left vertebral artery origin. There is no dissection, occlusion or flow-limiting stenosis to the  skull base (V1-V3 segments). CTA HEAD FINDINGS POSTERIOR CIRCULATION: --Vertebral arteries: Bilateral atherosclerotic calcification of the V4 segments with severe stenosis on the right. No stenosis on the left. --Posterior inferior cerebellar arteries (PICA): Patent origins from the vertebral arteries. --Anterior inferior cerebellar arteries (AICA): Patent origins from the basilar artery. --Basilar artery: Normal. --Superior cerebellar arteries: Normal. --Posterior cerebral arteries: Normal. Both originate from the basilar artery. Posterior communicating arteries (p-comm) are diminutive or absent. ANTERIOR CIRCULATION: --Intracranial internal carotid arteries: Atherosclerotic calcification of the internal carotid arteries at the skull base without hemodynamically significant stenosis. --Anterior cerebral arteries (ACA): Normal. Both A1 segments are present. Patent anterior communicating artery (a-comm). --Middle cerebral arteries (MCA): There is moderate stenosis of the distal right MCA M1 segment. There is no proximal occlusion. There is mild narrowing of the distal left M1 segment. The M2 branches are patent. There is tapering of an M3 branch in the area of the frontal operculum infarct. VENOUS SINUSES: As permitted by contrast timing, patent. ANATOMIC VARIANTS: None Review of the MIP images confirms the above findings. CT Brain Perfusion Findings: ASPECTS: 8 CBF (<30%) Volume: 11mL Perfusion (Tmax>6.0s) volume: 36mL Mismatch Volume: 25mL Infarction Location:Left frontal operculum IMPRESSION: 1. No emergent large vessel occlusion. 2. 11 mL core infarct in the left frontal operculum with surrounding 25 mL ischemic penumbra. 3. Mild narrowing of the distal left M1 segment with tapering of an M3 branch in the area of the frontal operculum infarct. 4. 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 5. Mild bilateral carotid bifurcation atherosclerosis without hemodynamically significant stenosis. 6.  Moderate stenosis of the distal right MCA M1 segment. These results were called by telephone at the time of interpretation on 12/19/2019 at 12:30 am to provider ERIC [facility] , who verbally acknowledged these results. Electronically Signed   By: Deatra Robinson M.D.   On: 12/19/2019 00:35   DG Chest 2 View  Result Date: 12/19/2019 CLINICAL DATA:  Infarct EXAM: CHEST - 2 VIEW COMPARISON:  None. FINDINGS: Mild cardiomegaly and remote median sternotomy. No focal airspace consolidation or pulmonary edema. No pleural effusion or pneumothorax. IMPRESSION: No active cardiopulmonary disease. Electronically Signed   By: Deatra Robinson M.D.   On: 12/19/2019 03:00   CT Code Stroke CTA Neck W/WO contrast  Result Date: 12/19/2019 CLINICAL DATA:  Aphasia EXAM: CT ANGIOGRAPHY HEAD AND NECK CT PERFUSION BRAIN TECHNIQUE: Multidetector CT imaging of the head and neck was performed using the standard protocol during bolus administration of intravenous contrast. Multiplanar CT image reconstructions and MIPs were obtained to evaluate the vascular anatomy. Carotid stenosis measurements (when applicable) are obtained utilizing NASCET criteria, using the distal internal carotid diameter as the denominator. Multiphase CT imaging of the brain was performed following IV bolus contrast injection. Subsequent parametric perfusion maps were calculated using RAPID software. CONTRAST:  OMNIPAQUE IOHEXOL 350 MG/ML SOLN COMPARISON:  None. FINDINGS: CTA NECK FINDINGS SKELETON: There is no bony spinal canal stenosis. No lytic or blastic lesion. OTHER NECK: Normal pharynx, larynx and major salivary glands. No cervical lymphadenopathy. Unremarkable thyroid gland. UPPER CHEST: No pneumothorax or pleural effusion. No  nodules or masses. AORTIC ARCH: There is no calcific atherosclerosis of the aortic arch. There is no aneurysm, dissection or hemodynamically significant stenosis of the visualized portion of the aorta. Conventional 3 vessel aortic  branching pattern. The visualized proximal subclavian arteries are widely patent. RIGHT CAROTID SYSTEM: No dissection, occlusion or aneurysm. Mild atherosclerotic calcification at the carotid bifurcation without hemodynamically significant stenosis. LEFT CAROTID SYSTEM: No dissection, occlusion or aneurysm. Mild atherosclerotic calcification at the carotid bifurcation without hemodynamically significant stenosis. VERTEBRAL ARTERIES: Left dominant configuration. Moderate calcification of the left vertebral artery origin. There is no dissection, occlusion or flow-limiting stenosis to the skull base (V1-V3 segments). CTA HEAD FINDINGS POSTERIOR CIRCULATION: --Vertebral arteries: Bilateral atherosclerotic calcification of the V4 segments with severe stenosis on the right. No stenosis on the left. --Posterior inferior cerebellar arteries (PICA): Patent origins from the vertebral arteries. --Anterior inferior cerebellar arteries (AICA): Patent origins from the basilar artery. --Basilar artery: Normal. --Superior cerebellar arteries: Normal. --Posterior cerebral arteries: Normal. Both originate from the basilar artery. Posterior communicating arteries (p-comm) are diminutive or absent. ANTERIOR CIRCULATION: --Intracranial internal carotid arteries: Atherosclerotic calcification of the internal carotid arteries at the skull base without hemodynamically significant stenosis. --Anterior cerebral arteries (ACA): Normal. Both A1 segments are present. Patent anterior communicating artery (a-comm). --Middle cerebral arteries (MCA): There is moderate stenosis of the distal right MCA M1 segment. There is no proximal occlusion. There is mild narrowing of the distal left M1 segment. The M2 branches are patent. There is tapering of an M3 branch in the area of the frontal operculum infarct. VENOUS SINUSES: As permitted by contrast timing, patent. ANATOMIC VARIANTS: None Review of the MIP images confirms the above findings. CT Brain  Perfusion Findings: ASPECTS: 8 CBF (<30%) Volume: 60mL Perfusion (Tmax>6.0s) volume: 10mL Mismatch Volume: 66mL Infarction Location:Left frontal operculum IMPRESSION: 1. No emergent large vessel occlusion. 2. 11 mL core infarct in the left frontal operculum with surrounding 25 mL ischemic penumbra. 3. Mild narrowing of the distal left M1 segment with tapering of an M3 branch in the area of the frontal operculum infarct. 4. 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 5. Mild bilateral carotid bifurcation atherosclerosis without hemodynamically significant stenosis. 6. Moderate stenosis of the distal right MCA M1 segment. These results were called by telephone at the time of interpretation on 12/19/2019 at 12:30 am to provider ERIC [facility] , who verbally acknowledged these results. Electronically Signed   By: Deatra Robinson M.D.   On: 12/19/2019 00:35   MR BRAIN WO CONTRAST  Result Date: 12/19/2019 CLINICAL DATA:  Acute onset of aphasia. EXAM: MRI HEAD WITHOUT CONTRAST TECHNIQUE: Multiplanar, multiecho pulse sequences of the brain and surrounding structures were obtained without intravenous contrast. COMPARISON:  Head CT 12/19/2019 FINDINGS: BRAIN: Intermediate sized acute infarct of the left frontal operculum. Early confluent hyperintense T2-weighted signal of the periventricular and deep white matter, most commonly due to chronic ischemic microangiopathy. 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 The cerebral and cerebellar volume are age-appropriate. There is no hydrocephalus. The midline structures are normal. VASCULAR: The major intracranial arterial and venous sinus flow voids are normal. Hemosiderin deposition at the site of old left parietal infarct. No acute hemorrhage. SKULL AND UPPER CERVICAL SPINE: Calvarial bone marrow signal is normal. There is no skull base mass. The visualized upper cervical spine and soft tissues are normal. SINUSES/ORBITS: Mild left  maxillary sinus mucosal thickening. The orbits are normal. IMPRESSION: 1. Intermediate sized acute infarct of the left  frontal operculum. No hemorrhage or mass effect. 2. 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 3. Moderate chronic small vessel disease. Electronically Signed   By: Deatra Robinson M.D.   On: 12/19/2019 02:49   CT Code Stroke Cerebral Perfusion with contrast  Result Date: 12/19/2019 CLINICAL DATA:  Aphasia EXAM: CT ANGIOGRAPHY HEAD AND NECK CT PERFUSION BRAIN TECHNIQUE: Multidetector CT imaging of the head and neck was performed using the standard protocol during bolus administration of intravenous contrast. Multiplanar CT image reconstructions and MIPs were obtained to evaluate the vascular anatomy. Carotid stenosis measurements (when applicable) are obtained utilizing NASCET criteria, using the distal internal carotid diameter as the denominator. Multiphase CT imaging of the brain was performed following IV bolus contrast injection. Subsequent parametric perfusion maps were calculated using RAPID software. CONTRAST:  OMNIPAQUE IOHEXOL 350 MG/ML SOLN COMPARISON:  None. FINDINGS: CTA NECK FINDINGS SKELETON: There is no bony spinal canal stenosis. No lytic or blastic lesion. OTHER NECK: Normal pharynx, larynx and major salivary glands. No cervical lymphadenopathy. Unremarkable thyroid gland. UPPER CHEST: No pneumothorax or pleural effusion. No nodules or masses. AORTIC ARCH: There is no calcific atherosclerosis of the aortic arch. There is no aneurysm, dissection or hemodynamically significant stenosis of the visualized portion of the aorta. Conventional 3 vessel aortic branching pattern. The visualized proximal subclavian arteries are widely patent. RIGHT CAROTID SYSTEM: No dissection, occlusion or aneurysm. Mild atherosclerotic calcification at the carotid bifurcation without hemodynamically significant stenosis. LEFT CAROTID SYSTEM: No dissection, occlusion or aneurysm. Mild  atherosclerotic calcification at the carotid bifurcation without hemodynamically significant stenosis. VERTEBRAL ARTERIES: Left dominant configuration. Moderate calcification of the left vertebral artery origin. There is no dissection, occlusion or flow-limiting stenosis to the skull base (V1-V3 segments). CTA HEAD FINDINGS POSTERIOR CIRCULATION: --Vertebral arteries: Bilateral atherosclerotic calcification of the V4 segments with severe stenosis on the right. No stenosis on the left. --Posterior inferior cerebellar arteries (PICA): Patent origins from the vertebral arteries. --Anterior inferior cerebellar arteries (AICA): Patent origins from the basilar artery. --Basilar artery: Normal. --Superior cerebellar arteries: Normal. --Posterior cerebral arteries: Normal. Both originate from the basilar artery. Posterior communicating arteries (p-comm) are diminutive or absent. ANTERIOR CIRCULATION: --Intracranial internal carotid arteries: Atherosclerotic calcification of the internal carotid arteries at the skull base without hemodynamically significant stenosis. --Anterior cerebral arteries (ACA): Normal. Both A1 segments are present. Patent anterior communicating artery (a-comm). --Middle cerebral arteries (MCA): There is moderate stenosis of the distal right MCA M1 segment. There is no proximal occlusion. There is mild narrowing of the distal left M1 segment. The M2 branches are patent. There is tapering of an M3 branch in the area of the frontal operculum infarct. VENOUS SINUSES: As permitted by contrast timing, patent. ANATOMIC VARIANTS: None Review of the MIP images confirms the above findings. CT Brain Perfusion Findings: ASPECTS: 8 CBF (<30%) Volume: 11mL Perfusion (Tmax>6.0s) volume: 36mL Mismatch Volume: 25mL Infarction Location:Left frontal operculum IMPRESSION: 1. No emergent large vessel occlusion. 2. 11 mL core infarct in the left frontal operculum with surrounding 25 mL ischemic penumbra. 3. Mild narrowing  of the distal left M1 segment with tapering of an M3 branch in the area of the frontal operculum infarct. 4. 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 5. Mild bilateral carotid bifurcation atherosclerosis without hemodynamically significant stenosis. 6. Moderate stenosis of the distal right MCA M1 segment. These results were called by telephone at the time of interpretation on 12/19/2019 at 12:30 am to provider ERIC [facility] , who verbally acknowledged  these results. Electronically Signed   By: Deatra Robinson M.D.   On: 12/19/2019 00:35   ECHOCARDIOGRAM COMPLETE  Result Date: 12/19/2019   ECHOCARDIOGRAM REPORT   Patient Name:   Raymond Vance Date of Exam: 12/19/2019 Medical Rec #:  [id number]    Height:       71.0 in Accession #:    [id number]   Weight:       215.6 lb Date of Birth:  Dec 15, 1941    BSA:          2.18 m Patient Age:    78 years     BP:           118/64 mmHg Patient Gender: M            HR:           37 bpm. Exam Location:  Inpatient Procedure: 2D Echo, Cardiac Doppler, Color Doppler and Intracardiac            Opacification Agent Indications:    Stroke  History:        Patient has no prior history of Echocardiogram examinations. CAD                 and Previous Myocardial Infarction, Prior CABG and Abnormal ECG,                 Stroke and COPD; Risk Factors:Hypertension.  Sonographer:    Sheralyn Boatman RDCS Referring Phys: [phone] JARED M GARDNER  Sonographer Comments: Technically difficult study due to poor echo windows, no subcostal window, suboptimal subcostal window and suboptimal apical window. IMPRESSIONS  1. Left ventricular ejection fraction, by visual estimation, is 20 to 25%. The left ventricle has severely decreased function. There is moderately increased left ventricular hypertrophy.  2. Definity contrast agent was given IV to delineate the left ventricular endocardial borders. No mural thrombus was noted.  3. Left ventricular diastolic function could not be evaluated.  4.  The left ventricle demonstrates global hypokinesis.  5. Global right ventricle has normal systolic function.The right ventricular size is normal. No increase in right ventricular wall thickness.  6. Left atrial size was moderately dilated.  7. Right atrial size was mildly dilated.  8. The mitral valve is grossly normal. Trivial mitral valve regurgitation.  9. The tricuspid valve is grossly normal. 10. The aortic valve is tricuspid. Aortic valve regurgitation is trivial. Mild aortic valve sclerosis without stenosis. 11. The pulmonic valve was not well visualized. Pulmonic valve regurgitation is not visualized. 12. Aortic dilatation noted. 13. 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 FINDINGS  Left Ventricle: Left ventricular ejection fraction, by visual estimation, is 20 to 25%. The left ventricle has severely decreased function. Definity contrast agent was given IV to delineate the left ventricular endocardial borders. The left ventricle demonstrates global hypokinesis. There is moderately increased left ventricular hypertrophy. The left ventricular diastology could not be evaluated due to indeterminate diastolic function. Left ventricular diastolic function could not be evaluated. Right Ventricle: The right ventricular size is normal. No increase in right ventricular wall thickness. Global RV systolic function is has normal systolic function. Left Atrium: Left atrial size was moderately dilated. Right Atrium: Right atrial size was mildly dilated Pericardium: There is no evidence of pericardial effusion. Mitral Valve: The mitral valve is grossly normal. There is mild thickening of the mitral valve leaflet(s). Trivial mitral valve regurgitation. Tricuspid Valve: The tricuspid valve is grossly normal. Tricuspid valve regurgitation is trivial. Aortic Valve: The  aortic valve is tricuspid. Aortic valve regurgitation is trivial. Mild aortic valve sclerosis is present, with no evidence of aortic  valve stenosis. Aortic valve mean gradient measures 7.5 mmHg. Aortic valve peak gradient measures 15.4 mmHg. Aortic valve area, by VTI measures 4.38 cm. Pulmonic Valve: The pulmonic valve was not well visualized. Pulmonic valve regurgitation is not visualized. Pulmonic regurgitation is not visualized. Aorta: Aortic dilatation noted. 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 Venous: The inferior vena cava was not well visualized. IAS/Shunts: No atrial level shunt detected by color flow Doppler.  LEFT VENTRICLE PLAX 2D LVIDd:         6.60 cm LVIDs:         5.90 cm LV PW:         1.50 cm LV IVS:        1.60 cm LVOT diam:     2.70 cm LV SV:         50 ml LV SV Index:   22.53 LVOT Area:     5.73 cm  LV Volumes (MOD) LV area d, A4C:    52.90 cm LV area s, A4C:    44.30 cm LV major d, A4C:   9.50 cm LV major s, A4C:   8.62 cm LV vol d, MOD A4C: 226.0 ml LV vol s, MOD A4C: 175.0 ml LV SV MOD A4C:     226.0 ml RIGHT VENTRICLE         IVC TAPSE (M-mode): 1.7 cm  IVC diam: 2.70 cm LEFT ATRIUM              Index       RIGHT ATRIUM           Index LA diam:        4.10 cm  1.88 cm/m  RA Area:     23.60 cm LA Vol (A2C):   104.0 ml 47.77 ml/m RA Volume:   77.40 ml  35.55 ml/m LA Vol (A4C):   71.6 ml  32.89 ml/m LA Biplane Vol: 90.4 ml  41.53 ml/m  AORTIC VALVE AV Area (Vmax):    4.27 cm AV Area (Vmean):   4.53 cm AV Area (VTI):     4.38 cm AV Vmax:           196.50 cm/s AV Vmean:          125.000 cm/s AV VTI:            0.412 m AV Peak Grad:      15.4 mmHg AV Mean Grad:      7.5 mmHg LVOT Vmax:         146.50 cm/s LVOT Vmean:        98.950 cm/s LVOT VTI:          0.316 m LVOT/AV VTI ratio: 0.77  AORTA Ao Root diam: 3.60 cm Ao Asc diam:  4.00 cm MITRAL VALVE MV Area (PHT): 4.06 cm             SHUNTS MV PHT:        54.13 msec           Systemic VTI:  0.32 m MV Decel Time: 187 msec             Systemic Diam: 2.70 cm MV E velocity: 85.87 cm/s 103 cm/s MV A velocity: 83.90 cm/s 70.3 cm/s MV E/A  ratio:  1.02       1.5  Lyman Bishop MD Electronically  signed by Zoila Shutter MD Signature Date/Time: 12/19/2019/12:00:27 PM    Final    CT HEAD CODE STROKE WO CONTRAST  Result Date: 12/19/2019 CLINICAL DATA:  Code stroke.  Confusion and aphasia EXAM: CT HEAD WITHOUT CONTRAST TECHNIQUE: Contiguous axial images were obtained from the base of the skull through the vertex without intravenous contrast. COMPARISON:  None. FINDINGS: Brain: There is no mass, hemorrhage or extra-axial collection. The size and configuration of the ventricles and extra-axial CSF spaces are normal. There is hypoattenuation of the periventricular white matter, most commonly indicating chronic ischemic microangiopathy. There is an area of acute or early subacute ischemia within the anterior left MCA distribution, involving the left frontal operculum. 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 Vascular: No abnormal hyperdensity of the major intracranial arteries or dural venous sinuses. No intracranial atherosclerosis. Skull: The visualized skull base, calvarium and extracranial soft tissues are normal. Sinuses/Orbits: Moderate left maxillary sinus mucosal thickening. The orbits are normal. ASPECTS [facility] Stroke Program Early CT Score) - Ganglionic level infarction (caudate, lentiform nuclei, internal capsule, insula, M1-M3 cortex): 6 - Supraganglionic infarction (M4-M6 cortex): 2 Total score (0-10 with 10 being normal): 8 IMPRESSION: 1. 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 2. No hemorrhage. 3. 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 4. ASPECTS is 8. 5. These results were communicated to Dr. Caryl Pina at 12:10 am on 12/19/2019 by text page via the [facility] messaging system. Electronically Signed   By: Deatra Robinson M.D.   On: 12/19/2019 00:12   Subjective: Patient was feeling better when seen this morning.  Has no new  complaints.  Wants to go home with home health.  Discharge Exam: Vitals:   12/21/19 1301 12/21/19 1541  BP:    Pulse:    Resp:    Temp: 97.7 F (36.5 C) 98.4 F (36.9 C)  SpO2:     Vitals:   12/21/19 0836 12/21/19 0838 12/21/19 1301 12/21/19 1541  BP:      Pulse: (!) 55     Resp: 16     Temp:  98 F (36.7 C) 97.7 F (36.5 C) 98.4 F (36.9 C)  TempSrc:  Oral Oral Oral  SpO2: 96%     Weight:      Height:        General: Pt is alert, awake, not in acute distress Cardiovascular: RRR, S1/S2 +, no rubs, no gallops Respiratory: CTA bilaterally, no wheezing, no rhonchi Abdominal: Soft, NT, ND, bowel sounds + Extremities: no edema, no cyanosis   The results of significant diagnostics from this hospitalization (including imaging, microbiology, ancillary and laboratory) are listed below for reference.    Microbiology: Recent Results (from the past 240 hour(s))  Respiratory Panel by RT PCR (Flu A&B, Covid) - Nasopharyngeal Swab     Status: None   Collection Time: 12/19/19 12:44 AM   Specimen: Nasopharyngeal Swab  Result Value Ref Range Status   SARS Coronavirus 2 by RT PCR NEGATIVE NEGATIVE Final    Comment: (NOTE) SARS-CoV-2 target nucleic acids are NOT DETECTED. The SARS-CoV-2 RNA is generally detectable in upper respiratoy specimens during the acute phase of infection. The lowest concentration of SARS-CoV-2 viral copies this assay can detect is 131 copies/mL. A negative result does not preclude SARS-Cov-2 infection and should not be used as the sole basis for treatment or other patient management decisions. A negative result may occur with  improper specimen collection/handling, submission of specimen other  than nasopharyngeal swab, presence of viral mutation(s) within the areas targeted by this assay, and inadequate number of viral copies (<131 copies/mL). A negative result must be combined with clinical observations, patient history, and epidemiological information.  The expected result is Negative. Fact Sheet for Patients:  https://www.moore.com/https://www.fda.gov/media/142436/download Fact Sheet for Healthcare Providers:  https://www.young.biz/https://www.fda.gov/media/142435/download This test is not yet ap proved or cleared by the [place] FDA and  has been authorized for detection and/or diagnosis of SARS-CoV-2 by FDA under an Emergency Use Authorization (EUA). This EUA will remain  in effect (meaning this test can be used) for the duration of the COVID-19 declaration under Section 564(b)(1) of the Act, 21 U.S.C. section 360bbb-3(b)(1), unless the authorization is terminated or revoked sooner.    Influenza A by PCR NEGATIVE NEGATIVE Final   Influenza B by PCR NEGATIVE NEGATIVE Final    Comment: (NOTE) The Xpert Xpress SARS-CoV-2/FLU/RSV assay is intended as an aid in  the diagnosis of influenza from Nasopharyngeal swab specimens and  should not be used as a sole basis for treatment. Nasal washings and  aspirates are unacceptable for Xpert Xpress SARS-CoV-2/FLU/RSV  testing. Fact Sheet for Patients: https://www.moore.com/https://www.fda.gov/media/142436/download Fact Sheet for Healthcare Providers: https://www.young.biz/https://www.fda.gov/media/142435/download This test is not yet approved or cleared by the [place] FDA and  has been authorized for detection and/or diagnosis of SARS-CoV-2 by  FDA under an Emergency Use Authorization (EUA). This EUA will remain  in effect (meaning this test can be used) for the duration of the  Covid-19 declaration under Section 564(b)(1) of the Act, 21  U.S.C. section 360bbb-3(b)(1), unless the authorization is  terminated or revoked. Performed at [facility], [address] [place], [place] [address]      Labs: BNP (last 3 results) No results for input(s): BNP in the last 8760 hours. Basic Metabolic Panel: Recent Labs  Lab 12/18/19 2351 12/18/19 2356  NA 136 136  K 4.5 4.4  CL 103 104  CO2 21*  --   GLUCOSE 103* 99  BUN 28* 31*  CREATININE 1.24 1.10   CALCIUM 9.1  --    Liver Function Tests: Recent Labs  Lab 12/18/19 2351  AST 16  ALT 19  ALKPHOS 50  BILITOT 0.7  PROT 6.5  ALBUMIN 3.8   No results for input(s): LIPASE, AMYLASE in the last 168 hours. No results for input(s): AMMONIA in the last 168 hours. CBC: Recent Labs  Lab 12/18/19 2351 12/18/19 2356  WBC 7.5  --   NEUTROABS 4.2  --   HGB 16.9 17.3*  HCT 51.7 51.0  MCV 93.2  --   PLT 169  --    Cardiac Enzymes: No results for input(s): CKTOTAL, CKMB, CKMBINDEX, TROPONINI in the last 168 hours. BNP: Invalid input(s): POCBNP CBG: Recent Labs  Lab 12/18/19 2350  GLUCAP 100*   D-Dimer No results for input(s): DDIMER in the last 72 hours. Hgb A1c Recent Labs    12/19/19 0332  HGBA1C 6.2*   Lipid Profile Recent Labs    12/19/19 0332  CHOL 179  HDL 50  LDLCALC 112*  TRIG 87  CHOLHDL 3.6   Thyroid function studies No results for input(s): TSH, T4TOTAL, T3FREE, THYROIDAB in the last 72 hours.  Invalid input(s): FREET3 Anemia work up No results for input(s): VITAMINB12, FOLATE, FERRITIN, TIBC, IRON, RETICCTPCT in the last 72 hours. Urinalysis No results found for: COLORURINE, APPEARANCEUR, LABSPEC, PHURINE, GLUCOSEU, HGBUR, BILIRUBINUR, KETONESUR, PROTEINUR, UROBILINOGEN, NITRITE, LEUKOCYTESUR Sepsis Labs Invalid input(s): PROCALCITONIN,  WBC,  LACTICIDVEN Microbiology Recent Results (from the past 240 hour(s))  Respiratory Panel by RT PCR (Flu A&B, Covid) - Nasopharyngeal Swab     Status: None   Collection Time: 12/19/19 12:44 AM   Specimen: Nasopharyngeal Swab  Result Value Ref Range Status   SARS Coronavirus 2 by RT PCR NEGATIVE NEGATIVE Final    Comment: (NOTE) SARS-CoV-2 target nucleic acids are NOT DETECTED. The SARS-CoV-2 RNA is generally detectable in upper respiratoy specimens during the acute phase of infection. The lowest concentration of SARS-CoV-2 viral copies this assay can detect is 131 copies/mL. A negative result does not  preclude SARS-Cov-2 infection and should not be used as the sole basis for treatment or other patient management decisions. A negative result may occur with  improper specimen collection/handling, submission of specimen other than nasopharyngeal swab, presence of viral mutation(s) within the areas targeted by this assay, and inadequate number of viral copies (<131 copies/mL). A negative result must be combined with clinical observations, patient history, and epidemiological information. The expected result is Negative. Fact Sheet for Patients:  https://www.moore.com/ Fact Sheet for Healthcare Providers:  https://www.young.biz/ This test is not yet ap proved or cleared by the [place] FDA and  has been authorized for detection and/or diagnosis of SARS-CoV-2 by FDA under an Emergency Use Authorization (EUA). This EUA will remain  in effect (meaning this test can be used) for the duration of the COVID-19 declaration under Section 564(b)(1) of the Act, 21 U.S.C. section 360bbb-3(b)(1), unless the authorization is terminated or revoked sooner.    Influenza A by PCR NEGATIVE NEGATIVE Final   Influenza B by PCR NEGATIVE NEGATIVE Final    Comment: (NOTE) The Xpert Xpress SARS-CoV-2/FLU/RSV assay is intended as an aid in  the diagnosis of influenza from Nasopharyngeal swab specimens and  should not be used as a sole basis for treatment. Nasal washings and  aspirates are unacceptable for Xpert Xpress SARS-CoV-2/FLU/RSV  testing. Fact Sheet for Patients: https://www.moore.com/ Fact Sheet for Healthcare Providers: https://www.young.biz/ This test is not yet approved or cleared by the [place] FDA and  has been authorized for detection and/or diagnosis of SARS-CoV-2 by  FDA under an Emergency Use Authorization (EUA). This EUA will remain  in effect (meaning this test can be used) for the duration of the   Covid-19 declaration under Section 564(b)(1) of the Act, 21  U.S.C. section 360bbb-3(b)(1), unless the authorization is  terminated or revoked. Performed at [facility], [address] [place], [place] [address]     Time coordinating discharge: Over 30 minutes  SIGNED:  Arnetha Courser, MD  [facility] 12/21/2019, 4:25 PM Pager [phone]  If 7PM-7AM, please contact night-coverage [email or web address] Password [email or web address]  This record has been created using Conservation officer, historic buildings. Errors have been sought and corrected,but may not always be located. Such creation errors do not reflect on the standard of care.

## 2019-12-21 NOTE — Progress Notes (Addendum)
Progress Note  Patient Name: Raymond Vance Date of Encounter: 12/21/2019  Primary Cardiologist: Dr. Jacques Vance (wants to follow in [place])  Subjective   Pt doing without without complaints today. Plan is for d/c home today. Confirmed with wife that he has been taking spiro.   Inpatient Medications    Scheduled Meds: . aspirin EC  325 mg Oral Daily  . atorvastatin  40 mg Oral q1800  . chlorhexidine  15 mL Mouth Rinse BID  . enoxaparin (LOVENOX) injection  40 mg Subcutaneous Q24H  . mouth rinse  15 mL Mouth Rinse BID  . metoprolol tartrate  25 mg Oral BID  . mometasone-formoterol  2 puff Inhalation BID   Continuous Infusions: . sodium chloride 50 mL/hr at 12/20/19 1017   PRN Meds: acetaminophen **OR** acetaminophen (TYLENOL) oral liquid 160 mg/5 mL **OR** acetaminophen, albuterol   Vital Signs    Vitals:   12/20/19 2312 12/21/19 0300 12/21/19 0836 12/21/19 0838  BP: (!) 109/52 121/65    Pulse: (!) 57 (!) 46 (!) 55   Resp: 14 16 16    Temp: 98.1 F (36.7 C) 98 F (36.7 C)  98 F (36.7 C)  TempSrc: Oral Oral  Oral  SpO2: 99%  96%   Weight:      Height:       No intake or output data in the 24 hours ending 12/21/19 1151 Filed Weights   12/19/19 0108 12/19/19 0359  Weight: 99.2 kg 97.8 kg    Physical Exam   General: Well developed, well nourished, NAD Neck: Negative for carotid bruits. No JVD Lungs:Clear to ausculation bilaterally. No wheezes, rales, or rhonchi. Breathing is unlabored. Cardiovascular: RRR with S1 S2. No murmurs, rubs, gallops, or LV heave appreciated. Extremities: No edema. PT pulses 2+ bilaterally Neuro: Alert and oriented to person and place. No focal deficits. No facial asymmetry. MAE spontaneously. Psych: Responds to questions somewhat appropriately with normal affect.    Labs    Chemistry Recent Labs  Lab 12/18/19 2351 12/18/19 2356  NA 136 136  K 4.5 4.4  CL 103 104  CO2 21*  --   GLUCOSE 103* 99  BUN 28* 31*  CREATININE 1.24  1.10  CALCIUM 9.1  --   PROT 6.5  --   ALBUMIN 3.8  --   AST 16  --   ALT 19  --   ALKPHOS 50  --   BILITOT 0.7  --   GFRNONAA 55*  --   GFRAA >60  --   ANIONGAP 12  --      Hematology Recent Labs  Lab 12/18/19 2351 12/18/19 2356  WBC 7.5  --   RBC 5.55  --   HGB 16.9 17.3*  HCT 51.7 51.0  MCV 93.2  --   MCH 30.5  --   MCHC 32.7  --   RDW 13.4  --   PLT 169  --     Cardiac EnzymesNo results for input(s): TROPONINI in the last 168 hours. No results for input(s): TROPIPOC in the last 168 hours.   BNPNo results for input(s): BNP, PROBNP in the last 168 hours.   DDimer No results for input(s): DDIMER in the last 168 hours.   Radiology    No results found.  Telemetry    12/21/2019 NSR with frequent vent bigeminy, 1st degree AV block HR's 60's- Personally Reviewed  ECG    No new tracing as of 12/21/2019- Personally Reviewed  Cardiac Studies   Echo 12/19/19  Sonographer Comments: Technically difficult study due to poor echo windows, no subcostal window, suboptimal subcostal window and suboptimal apical window. IMPRESSIONS  1. Left ventricular ejection fraction, by visual estimation, is 20 to 25%. The left ventricle has severely decreased function. There is moderately increased left ventricular hypertrophy. 2. Definity contrast agent was given IV to delineate the left ventricular endocardial borders. No mural thrombus was noted. 3. Left ventricular diastolic function could not be evaluated. 4. The left ventricle demonstrates global hypokinesis. 5. Global right ventricle has normal systolic function.The right ventricular size is normal. No increase in right ventricular wall thickness. 6. Left atrial size was moderately dilated. 7. Right atrial size was mildly dilated. 8. The mitral valve is grossly normal. Trivial mitral valve regurgitation. 9. The tricuspid valve is grossly normal. 10. The aortic valve is tricuspid. Aortic valve regurgitation is trivial.  Mild aortic valve sclerosis without stenosis. 11. The pulmonic valve was not well visualized. Pulmonic valve regurgitation is not visualized. 12. Aortic dilatation noted. 13. 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  Patient Profile     78 y.o. male with a hx of CAD s/p CABG (remote), former tobacco use, HTN, HLD, chronic systolic heart failure, COPD,  LBBB (per chart review), who is being seen today for the evaluation of heart failure at the request of Dr. Caleb Vance.  Assessment & Plan    1. Acute CVA: -CT head showed acute/early subacute ischemia of anterior left MCA. MRI showed intermediate sized infarct of left frontal section.  -Plan for ASA 325 mg daily, Lipitor 40 mg with Eliquis initiation (this evening) 12/21/2019 per neuro team   2. Ischemic cardiomyopathy/Chronic systolic Heart Failure: -Most recent echocardiogram this admission with LVEF of  20-25% moderate LVH, diastolic function could not be evaluated, global RV normal systolic function, no mural thrombus, global hypokinesis, moderately dilated LA and RA, trivial TR, trivial AR, mild AV sclerosis, aortic dilation noted -Previously LVEF was 30-35% in 2018, inferior wall with posterior wall hypokinesis -Metoprolol tartrate added during hospitalization with stable BP and HR>>continue current dose   -Lisinopril discontinued with plans to initiate Entresto in the OP setting  -Hold spironolactone to allow for high BP -Continue holding HCTZ  -Creatinine has been stable, 1.10 on 12/27   3. CAD s/p 2V CABG in 2006: -Denies anginal symptoms -Continue ASA, statin and BB  -EKG with old LBBB and nonspecific ST/T wave changes>>similar to prior   4. HTN: -Stable, 121/65 -Permissive hypertension per neurology -Meds as above   5. HLD: -LDL noted to be elevated above goal this admission -LDL, 112 -Continue statin therapy    Signed, Raymond ChardJill McDaniel NP-C [facility] Pager: [phone] 12/21/2019, 11:51 AM     For questions or updates, please contact   Please consult [email or web address] for contact info under Cardiology/STEMI.  Patient seen and examined with Raymond ChardJill McDaniel, NP-C.  Agree as above, with the following exceptions and changes as noted below.  Mr. Raymond Vance does not have evidence of heart failure on exam. Gen: NAD, CV: RRR, no murmurs, Lungs: clear, Abd: soft, Extrem: Warm, well perfused, no edema, Neuro/Psych: alert, responds with head nodding. All available labs, radiology testing, previous records reviewed.   He needs to reestablish with cardiology and would [facility].  We have arranged an appointment with Dr. Tomie Chinaevankar on 01/04/2020.  Patient would be a good candidate for Entresto and this can be initiated as an outpatient since the patient is leaving the hospital today.  Would  like to hold his ACE inhibitor in anticipation of starting Entresto.  In addition he was on spironolactone at home, however his blood pressure is 121/65 today.  For the benefit of slightly higher blood pressures that will allow for initiation of Entresto, it is reasonable to hold the low-dose of spironolactone at this time.  Continue beta-blocker, ASA, statin.  Eliquis on board for stroke.  If he is hypertensive as an outpatient, add back spironolactone.  He should check his blood pressures at home.  I have stressed the importance of cardiovascular follow-up given his history of ischemic cardiomyopathy.  Elouise Munroe 12/21/19 3:45 PM

## 2019-12-21 NOTE — TOC Transition Note (Signed)
Transition of Care [facility]) - CM/SW Discharge Note   Patient Details  Name: Ishmail Mcmanamon MRN: [id number] Date of Birth: 01/21/1941  Transition of [facility]) CM/SW Contact:  Pollie Friar, RN Phone Number: 12/21/2019, 4:31 PM   Clinical Narrative:    Pt discharging home with [facility] services through [place]. Tommi Rumps with [facility] accepted the referral.  [place] for home delivered to the room.  CM provided 30 day free card to wife for eliquis.  Pts grandson to provide transport home.    Final next level of care: Home w Home Health Services Barriers to Discharge: No Barriers Identified   Patient Goals and CMS Choice   CMS Medicare.gov Compare Post Acute Care list provided to:: Patient Choice offered to / list presented to : Patient, Spouse  Discharge Placement                       Discharge Plan and Services                DME Arranged: [place] rolling DME Agency: [employer] Date DME Agency Contacted: 12/21/19   Representative spoke with at DME Agency: [place]: PT, OT, RN, Social Work [employer] Agency: [place] Date [place]: 12/21/19   Representative spoke with at [place]: [place]) Interventions     Readmission Risk Interventions No flowsheet data found.

## 2019-12-27 DIAGNOSIS — M1990 Unspecified osteoarthritis, unspecified site: Secondary | ICD-10-CM | POA: Insufficient documentation

## 2019-12-27 HISTORY — DX: Unspecified osteoarthritis, unspecified site: M19.90

## 2019-12-29 DIAGNOSIS — I509 Heart failure, unspecified: Secondary | ICD-10-CM | POA: Insufficient documentation

## 2020-01-04 ENCOUNTER — Ambulatory Visit: Payer: Medicare HMO | Admitting: Cardiology

## 2020-01-05 ENCOUNTER — Ambulatory Visit (INDEPENDENT_AMBULATORY_CARE_PROVIDER_SITE_OTHER): Payer: Medicare HMO | Admitting: Cardiology

## 2020-01-05 ENCOUNTER — Encounter: Payer: Self-pay | Admitting: Cardiology

## 2020-01-05 ENCOUNTER — Other Ambulatory Visit: Payer: Self-pay

## 2020-01-05 VITALS — BP 134/74 | HR 49 | Ht 71.0 in | Wt 217.0 lb

## 2020-01-05 DIAGNOSIS — F17210 Nicotine dependence, cigarettes, uncomplicated: Secondary | ICD-10-CM | POA: Diagnosis not present

## 2020-01-05 DIAGNOSIS — I2510 Atherosclerotic heart disease of native coronary artery without angina pectoris: Secondary | ICD-10-CM | POA: Diagnosis not present

## 2020-01-05 DIAGNOSIS — I10 Essential (primary) hypertension: Secondary | ICD-10-CM

## 2020-01-05 DIAGNOSIS — E782 Mixed hyperlipidemia: Secondary | ICD-10-CM | POA: Diagnosis not present

## 2020-01-05 MED ORDER — ENTRESTO 24-26 MG PO TABS: 1.0000 | ORAL_TABLET | Freq: Two times a day (BID) | ORAL | 3 refills | Status: DC

## 2020-01-05 NOTE — Progress Notes (Signed)
Cardiology Office Note:    Date:  01/05/2020   ID:  Raymond Vance, DOB 11/11/1941, MRN [id number]  PCP:  Delilah Shan, MD  Cardiologist:  Jenean Lindau, MD   Referring MD: Delilah Shan, MD    ASSESSMENT:    1. Coronary artery disease involving native coronary artery of native heart without angina pectoris   2. Essential hypertension   3. Mixed dyslipidemia   4. Cigarette smoker    PLAN:    In order of problems listed above:  1. Recent stroke: Managed by neurology and primary care medicine.  On appropriate medications.  He is on anticoagulation for the same reason. 2. Coronary artery disease post CABG: Secondary prevention stressed to the patient.  Importance of compliance with diet and medication stressed and he vocalized understanding.  I reviewed hospital records extensively. 3. Cardiomyopathy: Advanced.  I discussed my findings with the patient at extensive length.  I discussed with him about Entresto therapy.  This has been recommended at hospital discharge.  His blood pressure is fine I will initiate this therapy.  Benefits and potential risks explained and he vocalized understanding. 4. Patient is on statin therapy.  This has been on hold.  Will do blood work today including Chem-7 liver panel today. 5. Cigarette smoker: I spent 5 minutes with the patient discussing solely about smoking. Smoking cessation was counseled. I suggested to the patient also different medications and pharmacological interventions. Patient is keen to try stopping on its own at this time. He will get back to me if he needs any further assistance in this matter. 6. Follow-up appointment in a week or earlier if he has any concerns.   Medication Adjustments/Labs and Tests Ordered: Current medicines are reviewed at length with the patient today.  Concerns regarding medicines are outlined above.  No orders of the defined types were placed in this encounter.  No orders of the defined types were  placed in this encounter.    No chief complaint on file.    History of Present Illness:    Raymond Vance is a 79 y.o. male.  Patient has past medical history of coronary artery disease post CABG in the past.  He is not followed up with cardiology for care.  He recently went to the hospital with stroke and was treated and released.  He has recovered well he has issues with speech but overall his speech can be comprehended.  No chest pain orthopnea or PND now.  Unfortunately continues to smoke.  His wife accompanies him for this visit.  Patient is on anticoagulation for stroke related issues.  At the time of my evaluation, the patient is alert awake oriented and in no distress.  Past Medical History:  Diagnosis Date  . COPD (chronic obstructive pulmonary disease) ([place])   . Hypertension   . STEMI (ST elevation myocardial infarction) [facility])     Past Surgical History:  Procedure Laterality Date  . CATARACT EXTRACTION    . CORONARY ARTERY BYPASS GRAFT      Current Medications: Current Meds  Medication Sig  . albuterol (VENTOLIN HFA) 108 (90 Base) MCG/ACT inhaler Inhale 2 puffs into the lungs every 6 (six) hours as needed for wheezing or shortness of breath.  Marland Kitchen apixaban (ELIQUIS) 5 MG TABS tablet Take 1 tablet (5 mg total) by mouth 2 (two) times daily.  Marland Kitchen aspirin 81 MG EC tablet Take 81 mg by mouth daily.  Marland Kitchen atorvastatin (LIPITOR) 40 MG tablet Take 40 mg by  mouth daily.  . budesonide-formoterol (SYMBICORT) 160-4.5 MCG/ACT inhaler Inhale 2 puffs into the lungs 2 (two) times daily.  . hydrochlorothiazide (HYDRODIURIL) 25 MG tablet Take 25 mg by mouth daily.  . metoprolol tartrate (LOPRESSOR) 50 MG tablet Take 50 mg by mouth daily.  Marland Kitchen omeprazole (PRILOSEC) 40 MG capsule Take 40 mg by mouth daily.     Allergies:   Pneumococcal vaccine   Social History   Socioeconomic History  . Marital status: Married    Spouse name: Not on file  . Number of children: Not on file  . Years of  education: Not on file  . Highest education level: Not on file  Occupational History  . Not on file  Tobacco Use  . Smoking status: Current Every Day Smoker  Substance and Sexual Activity  . Alcohol use: Not on file  . Drug use: Not on file  . Sexual activity: Not on file  Other Topics Concern  . Not on file  Social History Narrative  . Not on file   Social Determinants of Health   Financial Resource Strain:   . Difficulty of Paying Living Expenses: Not on file  Food Insecurity:   . Worried About Programme researcher, broadcasting/film/video in the Last Year: Not on file  . Ran Out of Food in the Last Year: Not on file  Transportation Needs:   . Lack of Transportation (Medical): Not on file  . Lack of Transportation (Non-Medical): Not on file  Physical Activity:   . Days of Exercise per Week: Not on file  . Minutes of Exercise per Session: Not on file  Stress:   . Feeling of Stress : Not on file  Social Connections:   . Frequency of Communication with Friends and Family: Not on file  . Frequency of Social Gatherings with Friends and Family: Not on file  . Attends Religious Services: Not on file  . Active Member of Clubs or Organizations: Not on file  . Attends Banker Meetings: Not on file  . Marital Status: Not on file     Family History: The patient's Family history is unknown by patient.  ROS:   Please see the history of present illness.    All other systems reviewed and are negative.  EKGs/Labs/Other Studies Reviewed:    The following studies were reviewed today: IMPRESSIONS    1. Left ventricular ejection fraction, by visual estimation, is 20 to 25%. The left ventricle has severely decreased function. There is moderately increased left ventricular hypertrophy.  2. Definity contrast agent was given IV to delineate the left ventricular endocardial borders. No mural thrombus was noted.  3. Left ventricular diastolic function could not be evaluated.  4. The left ventricle  demonstrates global hypokinesis.  5. Global right ventricle has normal systolic function.The right ventricular size is normal. No increase in right ventricular wall thickness.  6. Left atrial size was moderately dilated.  7. Right atrial size was mildly dilated.  8. The mitral valve is grossly normal. Trivial mitral valve regurgitation.  9. The tricuspid valve is grossly normal. 10. The aortic valve is tricuspid. Aortic valve regurgitation is trivial. Mild aortic valve sclerosis without stenosis. 11. The pulmonic valve was not well visualized. Pulmonic valve regurgitation is not visualized. 12. Aortic dilatation noted. 13. 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   Recent Labs: 12/18/2019: ALT 19; BUN 31; Creatinine, Ser 1.10; Hemoglobin 17.3; Platelets 169; Potassium 4.4; Sodium 136  Recent Lipid Panel  Component Value Date/Time   CHOL 179 12/19/2019 0332   TRIG 87 12/19/2019 0332   HDL 50 12/19/2019 0332   CHOLHDL 3.6 12/19/2019 0332   VLDL 17 12/19/2019 0332   LDLCALC 112 (H) 12/19/2019 0332    Physical Exam:    VS:  BP 134/74   Pulse (!) 49   Ht 5\' 11"  (1.803 m)   Wt 217 lb (98.4 kg)   SpO2 97%   BMI 30.27 kg/m     Wt Readings from Last 3 Encounters:  01/05/20 217 lb (98.4 kg)  12/19/19 215 lb 9.8 oz (97.8 kg)     GEN: Patient is in no acute distress HEENT: Normal NECK: No JVD; No carotid bruits LYMPHATICS: No lymphadenopathy CARDIAC: Hear sounds regular, 2/6 systolic murmur at the apex. RESPIRATORY:  Clear to auscultation without rales, wheezing or rhonchi  ABDOMEN: Soft, non-tender, non-distended MUSCULOSKELETAL:  No edema; No deformity  SKIN: Warm and dry NEUROLOGIC:  Alert and oriented x 3 PSYCHIATRIC:  Normal affect   Signed, 12/21/19, MD  01/05/2020 2:01 PM    [facility]

## 2020-01-05 NOTE — Patient Instructions (Signed)
Medication Instructions:  Your physician has recommended you make the following change in your medication:   START taking Entresto 24-26 (1 tablet) twice daily  *If you need a refill on your cardiac medications before your next appointment, please call your pharmacy*  Lab Work: Your physician recommends that you have a BMP and hepatic drawn today  If you have labs (blood work) drawn today and your tests are completely normal, you will receive your results only by: Raymond Vance MyChart Message (if you have MyChart) OR . A paper copy in the mail If you have any lab test that is abnormal or we need to change your treatment, we will call you to review the results.  Testing/Procedures: NONE  Follow-Up: At [facility], you and your health needs are our priority.  As part of our continuing mission to provide you with exceptional heart care, we have created designated Provider Care Teams.  These Care Teams include your primary Cardiologist (physician) and Advanced Practice Providers (APPs -  Physician Assistants and Nurse Practitioners) who all work together to provide you with the care you need, when you need it.  Your next appointment:   1 week(s)  The format for your next appointment:   In Person  Provider:   Jyl Heinz, MD  Other Instructions Sacubitril; Valsartan Oral Tablets What is this medicine? SACUBITRIL; VALSARTAN (sak UE bi tril; val SAR tan) is a combination of a neprilysin inhibitor and a an angiotensin II receptor blocker. It treats heart failure. This medicine may be used for other purposes; ask your health care provider or pharmacist if you have questions. COMMON BRAND NAME(S): Entresto What should I tell my health care provider before I take this medicine? They need to know if you have any of these conditions:  diabetes and take a medicine that contains aliskiren  kidney disease  liver disease  an unusual or allergic reaction to sacubitril; valsartan, drugs called  angiotensin converting enzyme (ACE) inhibitors, angiotensin II receptor blockers (ARBs), other medicines, foods, dyes, or preservatives  pregnant or trying to get pregnant  breast-feeding How should I use this medicine? Take this drug by mouth. Take it as directed on the prescription label at the same time every day. You can take it with or without food. If it upsets your stomach, take it with food. Keep taking it unless your health care provider tells you to stop. Talk to your health care provider about the use of this drug in children. While it may be prescribed for children as young as 1 for selected conditions, precautions do apply. Overdosage: If you think you have taken too much of this medicine contact a poison control center or emergency room at once. NOTE: This medicine is only for you. Do not share this medicine with others. What if I miss a dose? If you miss a dose, take it as soon as you can. If it is almost time for your next dose, take only that dose. Do not take double or extra doses. What may interact with this medicine? Do not take this medicine with any of the following medicines:  aliskiren if you have diabetes  angiotensin-converting enzyme (ACE) inhibitors, like benazepril, captopril, enalapril, fosinopril, lisinopril, or ramipril This medicine may also interact with the following medicines:  angiotensin II receptor blockers (ARBs) like azilsartan, candesartan, eprosartan, irbesartan, losartan, olmesartan, telmisartan, or valsartan  lithium  NSAIDS, medicines for pain and inflammation, like ibuprofen or naproxen  potassium-sparing diuretics like amiloride, spironolactone, and triamterene  potassium supplements  This list may not describe all possible interactions. Give your health care provider a list of all the medicines, herbs, non-prescription drugs, or dietary supplements you use. Also tell them if you smoke, drink alcohol, or use illegal drugs. Some items may  interact with your medicine. What should I watch for while using this medicine? Tell your doctor or healthcare professional if your symptoms do not start to get better or if they get worse. Do not become pregnant while taking this medicine. Women should inform their doctor if they wish to become pregnant or think they might be pregnant. There is a potential for serious side effects to an unborn child. Talk to your health care professional or pharmacist for more information. You may get dizzy. Do not drive, use machinery, or do anything that needs mental alertness until you know how this medicine affects you. Do not stand or sit up quickly, especially if you are an older patient. This reduces the risk of dizzy or fainting spells. Avoid alcoholic drinks; they can make you more dizzy. What side effects may I notice from receiving this medicine? Side effects that you should report to your doctor or health care professional as soon as possible:  allergic reactions like skin [place], itching or hives, swelling of the face, lips, or tongue  signs and symptoms of increased potassium like muscle weakness; chest pain; or fast, irregular heartbeat  signs and symptoms of kidney injury like trouble passing urine or change in the amount of urine  signs and symptoms of low blood pressure like feeling dizzy or lightheaded, or if you develop extreme fatigue Side effects that usually do not require medical attention (report to your doctor or health care professional if they continue or are bothersome):  cough This list may not describe all possible side effects. Call your doctor for medical advice about side effects. You may report side effects to FDA at 1-800-FDA-1088. Where should I keep my medicine? Keep out of the reach of children and pets. Store at room temperature between 20 and 25 degrees C (68 and 77 degrees F). Protect from moisture. Keep the container tightly closed. Throw away any unused drug after the  expiration date. NOTE: This sheet is a summary. It may not cover all possible information. If you have questions about this medicine, talk to your doctor, pharmacist, or health care provider.  2020 Elsevier/Gold Standard (2019-07-13 16:03:07)

## 2020-01-06 LAB — BASIC METABOLIC PANEL
BUN/Creatinine Ratio: 12 (ref 10–24)
BUN: 15 mg/dL (ref 8–27)
CO2: 26 mmol/L (ref 20–29)
Calcium: 9.5 mg/dL (ref 8.6–10.2)
Chloride: 100 mmol/L (ref 96–106)
Creatinine, Ser: 1.21 mg/dL (ref 0.76–1.27)
GFR calc Af Amer: 65 mL/min/{1.73_m2} (ref >59)
GFR calc non Af Amer: 57 mL/min/{1.73_m2} — ABNORMAL LOW (ref >59)
Glucose: 116 mg/dL — ABNORMAL HIGH (ref 65–99)
Potassium: 3.4 mmol/L — ABNORMAL LOW (ref 3.5–5.2)
Sodium: 139 mmol/L (ref 134–144)

## 2020-01-06 LAB — HEPATIC FUNCTION PANEL
ALT: 19 IU/L (ref 0–44)
AST: 16 IU/L (ref 0–40)
Albumin: 4.3 g/dL (ref 3.7–4.7)
Alkaline Phosphatase: 78 IU/L (ref 39–117)
Bilirubin Total: 0.6 mg/dL (ref 0.0–1.2)
Bilirubin, Direct: 0.21 mg/dL (ref 0.00–0.40)
Total Protein: 6.6 g/dL (ref 6.0–8.5)

## 2020-01-10 ENCOUNTER — Telehealth: Payer: Self-pay

## 2020-01-10 NOTE — Telephone Encounter (Signed)
Information relayed, patient will increase potassium rich foods. Scheduled for f/u with [facility] on 01/13/20 for repeat labs also.

## 2020-01-10 NOTE — Telephone Encounter (Signed)
-----   Message from Garwin Brothers, MD sent at 01/06/2020 12:04 PM EST ----- Labs are fine.  Potassium was borderline low and patient needs to take potassium supplementation and diet by taking foods rich in potassium.  Recheck in 1 week. Garwin Brothers, MD 01/06/2020 12:03 PM

## 2020-01-13 ENCOUNTER — Ambulatory Visit (INDEPENDENT_AMBULATORY_CARE_PROVIDER_SITE_OTHER): Payer: Medicare HMO | Admitting: Cardiology

## 2020-01-13 ENCOUNTER — Other Ambulatory Visit: Payer: Self-pay

## 2020-01-13 ENCOUNTER — Encounter: Payer: Self-pay | Admitting: Cardiology

## 2020-01-13 VITALS — BP 112/60 | HR 44 | Ht 71.0 in | Wt 215.0 lb

## 2020-01-13 DIAGNOSIS — F17210 Nicotine dependence, cigarettes, uncomplicated: Secondary | ICD-10-CM | POA: Diagnosis not present

## 2020-01-13 DIAGNOSIS — I10 Essential (primary) hypertension: Secondary | ICD-10-CM

## 2020-01-13 DIAGNOSIS — I2510 Atherosclerotic heart disease of native coronary artery without angina pectoris: Secondary | ICD-10-CM

## 2020-01-13 DIAGNOSIS — I429 Cardiomyopathy, unspecified: Secondary | ICD-10-CM

## 2020-01-13 HISTORY — DX: Cardiomyopathy, unspecified: I42.9

## 2020-01-13 MED ORDER — METOPROLOL TARTRATE 50 MG PO TABS: 25.0000 mg | ORAL_TABLET | Freq: Every day | ORAL | 2 refills | Status: DC

## 2020-01-13 MED ORDER — HYDROCHLOROTHIAZIDE 25 MG PO TABS: 12.5000 mg | ORAL_TABLET | Freq: Every day | ORAL | 3 refills | Status: DC

## 2020-01-13 NOTE — Patient Instructions (Signed)
Medication Instructions:  Your physician has recommended you make the following change in your medication:   DECREASE metoprolol to 25mg  (0.5 tablet) once daily  DECREASE HCTZ to 12.5 mg (0.5 tablet) once daily *If you need a refill on your cardiac medications before your next appointment, please call your pharmacy*  Lab Work: Your physician recommends that you have a BMP drawn today  If you have labs (blood work) drawn today and your tests are completely normal, you will receive your results only by: MyChart Message (if you have MyChart) OR . A paper copy in the mail If you have any lab test that is abnormal or we need to change your treatment, we will call you to review the results.  Testing/Procedures: NONE  Follow-Up: At [facility], you and your health needs are our priority.  As part of our continuing mission to provide you with exceptional heart care, we have created designated Provider Care Teams.  These Care Teams include your primary Cardiologist (physician) and Advanced Practice Providers (APPs -  Physician Assistants and Nurse Practitioners) who all work together to provide you with the care you need, when you need it.  Your next appointment:   1 month(s)  The format for your next appointment:   In Person  Provider:   [facility], MD

## 2020-01-13 NOTE — Progress Notes (Signed)
Cardiology Office Note:    Date:  01/13/2020   ID:  Raymond Vance, DOB 1941/02/09, MRN [id number]  PCP:  Wilburn Mylar, MD  Cardiologist:  Garwin Brothers, MD   Referring MD: Wilburn Mylar, MD    ASSESSMENT:    1. Coronary artery disease involving native coronary artery of native heart without angina pectoris   2. Essential hypertension   3. Cigarette smoker   4. Cardiomyopathy, unspecified type ([facility])    PLAN:    In order of problems listed above:  1. Cardiomyopathy: Patient is stable from this perspective.  He is tolerating Entresto well.  Because of symptoms suggesting of hypertension in the postural manner I decided to cut down metoprolol and hydrochlorothiazide to half dose.  He understands.  Importance of diet and regular exercise to the best of his ability emphasized.  He will check his weights on a regular basis. 2. Essential hypertension: As mentioned above blood pressure is stable 3. Cigarette smoker: I again revisited this issue and told him to quit. 4. He will have a Chem-7 today. 5. Follow-up appointment in a month or earlier if he has any concerns.   Medication Adjustments/Labs and Tests Ordered: Current medicines are reviewed at length with the patient today.  Concerns regarding medicines are outlined above.  Orders Placed This Encounter  Procedures  . Basic Metabolic Panel (BMET)   Meds ordered this encounter  Medications  . metoprolol tartrate (LOPRESSOR) 50 MG tablet    Sig: Take 0.5 tablets (25 mg total) by mouth daily.    Dispense:  30 tablet    Refill:  2  . hydrochlorothiazide (HYDRODIURIL) 25 MG tablet    Sig: Take 0.5 tablets (12.5 mg total) by mouth daily.    Dispense:  30 tablet    Refill:  3     Chief Complaint  Patient presents with  . Follow-up    1 WK FU      History of Present Illness:    Raymond Vance is a 79 y.o. male.  Patient has past medical history of cardiomyopathy, essential hypertension and history of stroke.   Unfortunately continues to smoke.  I had an extensive counseling with him last time.  He is tolerating Entresto well except he feels a little lightheaded when he changes posture.  At the time of my evaluation, the patient is alert awake oriented and in no distress.  Past Medical History:  Diagnosis Date  . COPD (chronic obstructive pulmonary disease) ([facility])   . Hypertension   . STEMI (ST elevation myocardial infarction) [facility])     Past Surgical History:  Procedure Laterality Date  . CATARACT EXTRACTION    . CORONARY ARTERY BYPASS GRAFT      Current Medications: Current Meds  Medication Sig  . albuterol (VENTOLIN HFA) 108 (90 Base) MCG/ACT inhaler Inhale 2 puffs into the lungs every 6 (six) hours as needed for wheezing or shortness of breath.  Marland Kitchen apixaban (ELIQUIS) 5 MG TABS tablet Take 1 tablet (5 mg total) by mouth 2 (two) times daily.  Marland Kitchen aspirin 81 MG EC tablet Take 81 mg by mouth daily.  Marland Kitchen atorvastatin (LIPITOR) 40 MG tablet Take 40 mg by mouth daily.  . budesonide-formoterol (SYMBICORT) 160-4.5 MCG/ACT inhaler Inhale 2 puffs into the lungs 2 (two) times daily.  . hydrochlorothiazide (HYDRODIURIL) 25 MG tablet Take 0.5 tablets (12.5 mg total) by mouth daily.  . metoprolol tartrate (LOPRESSOR) 50 MG tablet Take 0.5 tablets (25 mg total) by mouth daily.  Marland Kitchen  omeprazole (PRILOSEC) 40 MG capsule Take 40 mg by mouth daily.  . sacubitril-valsartan (ENTRESTO) 24-26 MG Take 1 tablet by mouth 2 (two) times daily.  . [DISCONTINUED] hydrochlorothiazide (HYDRODIURIL) 25 MG tablet Take 25 mg by mouth daily.  . [DISCONTINUED] metoprolol tartrate (LOPRESSOR) 50 MG tablet Take 50 mg by mouth daily.     Allergies:   Pneumococcal vaccine   Social History   Socioeconomic History  . Marital status: Married    Spouse name: Not on file  . Number of children: Not on file  . Years of education: Not on file  . Highest education level: Not on file  Occupational History  . Not on file  Tobacco Use  .  Smoking status: Current Every Day Smoker  . Smokeless tobacco: Never Used  Substance and Sexual Activity  . Alcohol use: Never  . Drug use: Never  . Sexual activity: Not on file  Other Topics Concern  . Not on file  Social History Narrative  . Not on file   Social Determinants of Health   Financial Resource Strain:   . Difficulty of Paying Living Expenses: Not on file  Food Insecurity:   . Worried About Programme researcher, broadcasting/film/video in the Last Year: Not on file  . Ran Out of Food in the Last Year: Not on file  Transportation Needs:   . Lack of Transportation (Medical): Not on file  . Lack of Transportation (Non-Medical): Not on file  Physical Activity:   . Days of Exercise per Week: Not on file  . Minutes of Exercise per Session: Not on file  Stress:   . Feeling of Stress : Not on file  Social Connections:   . Frequency of Communication with Friends and Family: Not on file  . Frequency of Social Gatherings with Friends and Family: Not on file  . Attends Religious Services: Not on file  . Active Member of Clubs or Organizations: Not on file  . Attends Banker Meetings: Not on file  . Marital Status: Not on file     Family History: The patient's Family history is unknown by patient.  ROS:   Please see the history of present illness.    All other systems reviewed and are negative.  EKGs/Labs/Other Studies Reviewed:    The following studies were reviewed today: I discussed my findings with the patient at extensive length.   Recent Labs: 12/18/2019: Hemoglobin 17.3; Platelets 169 01/05/2020: ALT 19; BUN 15; Creatinine, Ser 1.21; Potassium 3.4; Sodium 139  Recent Lipid Panel    Component Value Date/Time   CHOL 179 12/19/2019 0332   TRIG 87 12/19/2019 0332   HDL 50 12/19/2019 0332   CHOLHDL 3.6 12/19/2019 0332   VLDL 17 12/19/2019 0332   LDLCALC 112 (H) 12/19/2019 0332    Physical Exam:    VS:  BP 112/60   Pulse (!) 44   Ht 5\' 11"  (1.803 m)   Wt 215 lb  (97.5 kg)   SpO2 94%   BMI 29.99 kg/m     Wt Readings from Last 3 Encounters:  01/13/20 215 lb (97.5 kg)  01/05/20 217 lb (98.4 kg)  12/19/19 215 lb 9.8 oz (97.8 kg)     GEN: Patient is in no acute distress HEENT: Normal NECK: No JVD; No carotid bruits LYMPHATICS: No lymphadenopathy CARDIAC: Hear sounds regular, 2/6 systolic murmur at the apex. RESPIRATORY:  Clear to auscultation without rales, wheezing or rhonchi  ABDOMEN: Soft, non-tender, non-distended MUSCULOSKELETAL:  No edema;  No deformity  SKIN: Warm and dry NEUROLOGIC:  Alert and oriented x 3 PSYCHIATRIC:  Normal affect   Signed, Jenean Lindau, MD  01/13/2020 3:07 PM    [facility]

## 2020-01-14 LAB — BASIC METABOLIC PANEL
BUN/Creatinine Ratio: 17 (ref 10–24)
BUN: 21 mg/dL (ref 8–27)
CO2: 28 mmol/L (ref 20–29)
Calcium: 9.7 mg/dL (ref 8.6–10.2)
Chloride: 101 mmol/L (ref 96–106)
Creatinine, Ser: 1.25 mg/dL (ref 0.76–1.27)
GFR calc Af Amer: 63 mL/min/{1.73_m2} (ref >59)
GFR calc non Af Amer: 54 mL/min/{1.73_m2} — ABNORMAL LOW (ref >59)
Glucose: 142 mg/dL — ABNORMAL HIGH (ref 65–99)
Potassium: 3.9 mmol/L (ref 3.5–5.2)
Sodium: 141 mmol/L (ref 134–144)

## 2020-02-10 ENCOUNTER — Ambulatory Visit: Payer: Medicare HMO | Admitting: Cardiology

## 2020-02-21 ENCOUNTER — Ambulatory Visit: Payer: Medicare HMO | Admitting: Cardiology

## 2020-02-21 ENCOUNTER — Other Ambulatory Visit: Payer: Self-pay

## 2020-02-21 ENCOUNTER — Encounter: Payer: Self-pay | Admitting: Cardiology

## 2020-02-21 VITALS — BP 124/70 | HR 99 | Ht 71.0 in | Wt 216.0 lb

## 2020-02-21 DIAGNOSIS — Z13220 Encounter for screening for lipoid disorders: Secondary | ICD-10-CM

## 2020-02-21 DIAGNOSIS — I429 Cardiomyopathy, unspecified: Secondary | ICD-10-CM

## 2020-02-21 DIAGNOSIS — F17210 Nicotine dependence, cigarettes, uncomplicated: Secondary | ICD-10-CM | POA: Diagnosis not present

## 2020-02-21 DIAGNOSIS — I2510 Atherosclerotic heart disease of native coronary artery without angina pectoris: Secondary | ICD-10-CM | POA: Diagnosis not present

## 2020-02-21 DIAGNOSIS — Z1329 Encounter for screening for other suspected endocrine disorder: Secondary | ICD-10-CM

## 2020-02-21 DIAGNOSIS — I10 Essential (primary) hypertension: Secondary | ICD-10-CM | POA: Diagnosis not present

## 2020-02-21 DIAGNOSIS — J431 Panlobular emphysema: Secondary | ICD-10-CM

## 2020-02-21 NOTE — Patient Instructions (Signed)
Medication Instructions:  No medication changes *If you need a refill on your cardiac medications before your next appointment, please call your pharmacy*   Lab Work: You had a BMET, CBC, TSH, LFT and Lipids done today. If you have labs (blood work) drawn today and your tests are completely normal, you will receive your results only by: Marland Kitchen MyChart Message (if you have MyChart) OR . A paper copy in the mail If you have any lab test that is abnormal or we need to change your treatment, we will call you to review the results.   Testing/Procedures: None ordered   Follow-Up: At [facility], you and your health needs are our priority.  As part of our continuing mission to provide you with exceptional heart care, we have created designated Provider Care Teams.  These Care Teams include your primary Cardiologist (physician) and Advanced Practice Providers (APPs -  Physician Assistants and Nurse Practitioners) who all work together to provide you with the care you need, when you need it.  We recommend signing up for the patient portal called "MyChart".  Sign up information is provided on this After Visit Summary.  MyChart is used to connect with patients for Virtual Visits (Telemedicine).  Patients are able to view lab/test results, encounter notes, upcoming appointments, etc.  Non-urgent messages can be sent to your provider as well.   To learn more about what you can do with MyChart, go to [email or web address].    Your next appointment:   1 month(s)  The format for your next appointment:   In Person  Provider:   Belva Crome, MD   Other Instructions NA

## 2020-02-21 NOTE — Progress Notes (Signed)
Cardiology Office Note:    Date:  02/21/2020   ID:  Raymond Vance, DOB 1941-06-12, MRN [id number]  PCP:  Wilburn Mylar, MD  Cardiologist:  Garwin Brothers, MD   Referring MD: Wilburn Mylar, MD    ASSESSMENT:    1. Cardiomyopathy, unspecified type (HCC)   2. Essential hypertension   3. Coronary artery disease involving native coronary artery of native heart without angina pectoris   4. Cigarette smoker   5. Panlobular emphysema (HCC)    PLAN:    In order of problems listed above:  1. Advanced cardiomyopathy: Congestive heart failure issues were discussed at extensive length again including salt intake and regular weight check.  He promises to comply. 2. Essential hypertension: Blood pressure is stable and his symptoms are much better now.  I do not think there is room for beta-blockers as addition of any medications makes him feel dizzy lightheaded not well.  Is much better after discontinuing this medications in a symptomatic fashion. 3. In view of cardiomyopathy I discussed defibrillator issues but is not keen on it.  I explained the benefits and risks.  I explained the risks of sudden cardiac death in the absence of these modalities and he understands. 4. He will have blood work today including fasting lipids and a Chem-7 5. Cigarette smoker: I spent 5 minutes with the patient discussing solely about smoking. Smoking cessation was counseled. I suggested to the patient also different medications and pharmacological interventions. Patient is keen to try stopping on its own at this time. He will get back to me if he needs any further assistance in this matter. 6. Patient will be seen in follow-up appointment in 1 month or earlier if the patient has any concerns    Medication Adjustments/Labs and Tests Ordered: Current medicines are reviewed at length with the patient today.  Concerns regarding medicines are outlined above.  No orders of the defined types were placed in this  encounter.  No orders of the defined types were placed in this encounter.    Chief Complaint  Patient presents with  . Follow-up    1 Month     History of Present Illness:    Raymond Vance is a 79 y.o. male.  Patient has past medical history of advanced cardiomyopathy with severely depressed ejection fraction essential hypertension and continues to smoke.  He denies any problems at this time.  He leads a sedentary lifestyle.  No chest pain orthopnea or PND.  At the time of my evaluation, the patient is alert awake oriented and in no distress.  Past Medical History:  Diagnosis Date  . COPD (chronic obstructive pulmonary disease) (HCC)   . Hypertension   . STEMI (ST elevation myocardial infarction) [facility])     Past Surgical History:  Procedure Laterality Date  . CATARACT EXTRACTION    . CORONARY ARTERY BYPASS GRAFT      Current Medications: Current Meds  Medication Sig  . albuterol (VENTOLIN HFA) 108 (90 Base) MCG/ACT inhaler Inhale 2 puffs into the lungs every 6 (six) hours as needed for wheezing or shortness of breath.  Marland Kitchen apixaban (ELIQUIS) 5 MG TABS tablet Take 1 tablet (5 mg total) by mouth 2 (two) times daily.  Marland Kitchen aspirin 81 MG EC tablet Take 81 mg by mouth daily.  Marland Kitchen atorvastatin (LIPITOR) 40 MG tablet Take 40 mg by mouth daily.  . budesonide-formoterol (SYMBICORT) 160-4.5 MCG/ACT inhaler Inhale 2 puffs into the lungs 2 (two) times daily.  . hydrochlorothiazide (  HYDRODIURIL) 25 MG tablet Take 0.5 tablets (12.5 mg total) by mouth daily.  . metoprolol tartrate (LOPRESSOR) 50 MG tablet Take 0.5 tablets (25 mg total) by mouth daily.  Marland Kitchen omeprazole (PRILOSEC) 40 MG capsule Take 40 mg by mouth daily.  . sacubitril-valsartan (ENTRESTO) 24-26 MG Take 1 tablet by mouth 2 (two) times daily.     Allergies:   Pneumococcal vaccine   Social History   Socioeconomic History  . Marital status: Married    Spouse name: Not on file  . Number of children: Not on file  . Years of  education: Not on file  . Highest education level: Not on file  Occupational History  . Not on file  Tobacco Use  . Smoking status: Current Every Day Smoker  . Smokeless tobacco: Never Used  Substance and Sexual Activity  . Alcohol use: Never  . Drug use: Never  . Sexual activity: Not on file  Other Topics Concern  . Not on file  Social History Narrative  . Not on file   Social Determinants of Health   Financial Resource Strain:   . Difficulty of Paying Living Expenses: Not on file  Food Insecurity:   . Worried About Charity fundraiser in the Last Year: Not on file  . Ran Out of Food in the Last Year: Not on file  Transportation Needs:   . Lack of Transportation (Medical): Not on file  . Lack of Transportation (Non-Medical): Not on file  Physical Activity:   . Days of Exercise per Week: Not on file  . Minutes of Exercise per Session: Not on file  Stress:   . Feeling of Stress : Not on file  Social Connections:   . Frequency of Communication with Friends and Family: Not on file  . Frequency of Social Gatherings with Friends and Family: Not on file  . Attends Religious Services: Not on file  . Active Member of Clubs or Organizations: Not on file  . Attends Archivist Meetings: Not on file  . Marital Status: Not on file     Family History: The patient's Family history is unknown by patient.  ROS:   Please see the history of present illness.    All other systems reviewed and are negative.  EKGs/Labs/Other Studies Reviewed:    The following studies were reviewed today: IMPRESSIONS    1. Left ventricular ejection fraction, by visual estimation, is 20 to  25%. The left ventricle has severely decreased function. There is  moderately increased left ventricular hypertrophy.  2. Definity contrast agent was given IV to delineate the left ventricular  endocardial borders. No mural thrombus was noted.  3. Left ventricular diastolic function could not be  evaluated.  4. The left ventricle demonstrates global hypokinesis.  5. Global right ventricle has normal systolic function.The right  ventricular size is normal. No increase in right ventricular wall  thickness.  6. Left atrial size was moderately dilated.  7. Right atrial size was mildly dilated.  8. The mitral valve is grossly normal. Trivial mitral valve  regurgitation.  9. The tricuspid valve is grossly normal.  10. The aortic valve is tricuspid. Aortic valve regurgitation is trivial.  Mild aortic valve sclerosis without stenosis.  11. The pulmonic valve was not well visualized. Pulmonic valve  regurgitation is not visualized.  12. Aortic dilatation noted.  13. 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    Recent Labs: 12/18/2019: Hemoglobin 17.3; Platelets 169 01/05/2020: ALT  19 01/13/2020: BUN 21; Creatinine, Ser 1.25; Potassium 3.9; Sodium 141  Recent Lipid Panel    Component Value Date/Time   CHOL 179 12/19/2019 0332   TRIG 87 12/19/2019 0332   HDL 50 12/19/2019 0332   CHOLHDL 3.6 12/19/2019 0332   VLDL 17 12/19/2019 0332   LDLCALC 112 (H) 12/19/2019 0332    Physical Exam:    VS:  BP 124/70   Pulse 99   Ht 5\' 11"  (1.803 m)   Wt 216 lb (98 kg)   SpO2 96%   BMI 30.13 kg/m     Wt Readings from Last 3 Encounters:  02/21/20 216 lb (98 kg)  01/13/20 215 lb (97.5 kg)  01/05/20 217 lb (98.4 kg)     GEN: Patient is in no acute distress HEENT: Normal NECK: No JVD; No carotid bruits LYMPHATICS: No lymphadenopathy CARDIAC: Hear sounds regular, 2/6 systolic murmur at the apex. RESPIRATORY:  Clear to auscultation without rales, wheezing or rhonchi  ABDOMEN: Soft, non-tender, non-distended MUSCULOSKELETAL:  No edema; No deformity  SKIN: Warm and dry NEUROLOGIC:  Alert and oriented x 3 PSYCHIATRIC:  Normal affect   Signed, 01/07/20, MD  02/21/2020 9:36 AM    [facility]

## 2020-02-21 NOTE — Addendum Note (Signed)
Addended by: Eleonore Chiquito on: 02/21/2020 09:51 AM   Modules accepted: Orders

## 2020-02-22 ENCOUNTER — Encounter: Payer: Self-pay | Admitting: *Deleted

## 2020-02-22 LAB — CBC WITH DIFFERENTIAL/PLATELET
Basophils Absolute: 0.1 10*3/uL (ref 0.0–0.2)
Basos: 1 %
EOS (ABSOLUTE): 0.2 10*3/uL (ref 0.0–0.4)
Eos: 3 %
Hematocrit: 49.5 % (ref 37.5–51.0)
Hemoglobin: 17.4 g/dL (ref 13.0–17.7)
Immature Grans (Abs): 0 10*3/uL (ref 0.0–0.1)
Immature Granulocytes: 0 %
Lymphocytes Absolute: 2.2 10*3/uL (ref 0.7–3.1)
Lymphs: 25 %
MCH: 32 pg (ref 26.6–33.0)
MCHC: 35.2 g/dL (ref 31.5–35.7)
MCV: 91 fL (ref 79–97)
Monocytes Absolute: 0.6 10*3/uL (ref 0.1–0.9)
Monocytes: 7 %
Neutrophils Absolute: 5.5 10*3/uL (ref 1.4–7.0)
Neutrophils: 64 %
Platelets: 192 10*3/uL (ref 150–450)
RBC: 5.43 x10E6/uL (ref 4.14–5.80)
RDW: 13.3 % (ref 11.6–15.4)
WBC: 8.6 10*3/uL (ref 3.4–10.8)

## 2020-02-22 LAB — BASIC METABOLIC PANEL
BUN/Creatinine Ratio: 16 (ref 10–24)
BUN: 20 mg/dL (ref 8–27)
CO2: 21 mmol/L (ref 20–29)
Calcium: 9.7 mg/dL (ref 8.6–10.2)
Chloride: 101 mmol/L (ref 96–106)
Creatinine, Ser: 1.24 mg/dL (ref 0.76–1.27)
GFR calc Af Amer: 63 mL/min/{1.73_m2} (ref >59)
GFR calc non Af Amer: 55 mL/min/{1.73_m2} — ABNORMAL LOW (ref >59)
Glucose: 105 mg/dL — ABNORMAL HIGH (ref 65–99)
Potassium: 4.1 mmol/L (ref 3.5–5.2)
Sodium: 140 mmol/L (ref 134–144)

## 2020-02-22 LAB — HEPATIC FUNCTION PANEL
ALT: 19 IU/L (ref 0–44)
AST: 16 IU/L (ref 0–40)
Albumin: 4.4 g/dL (ref 3.7–4.7)
Alkaline Phosphatase: 79 IU/L (ref 39–117)
Bilirubin Total: 0.7 mg/dL (ref 0.0–1.2)
Bilirubin, Direct: 0.22 mg/dL (ref 0.00–0.40)
Total Protein: 6.6 g/dL (ref 6.0–8.5)

## 2020-02-22 LAB — LIPID PANEL
Chol/HDL Ratio: 2.5 ratio (ref 0.0–5.0)
Cholesterol, Total: 151 mg/dL (ref 100–199)
HDL: 60 mg/dL (ref >39)
LDL Chol Calc (NIH): 74 mg/dL (ref 0–99)
Triglycerides: 92 mg/dL (ref 0–149)
VLDL Cholesterol Cal: 17 mg/dL (ref 5–40)

## 2020-02-22 LAB — TSH: TSH: 1.76 u[IU]/mL (ref 0.450–4.500)

## 2020-03-26 ENCOUNTER — Ambulatory Visit: Payer: Medicare HMO | Admitting: Cardiology

## 2020-03-30 ENCOUNTER — Encounter: Payer: Self-pay | Admitting: Cardiology

## 2020-03-30 ENCOUNTER — Ambulatory Visit: Payer: Medicare HMO | Admitting: Cardiology

## 2020-03-30 ENCOUNTER — Other Ambulatory Visit: Payer: Self-pay

## 2020-03-30 VITALS — BP 112/72 | HR 66 | Ht 71.0 in | Wt 217.0 lb

## 2020-03-30 DIAGNOSIS — I429 Cardiomyopathy, unspecified: Secondary | ICD-10-CM

## 2020-03-30 DIAGNOSIS — Z720 Tobacco use: Secondary | ICD-10-CM

## 2020-03-30 DIAGNOSIS — I10 Essential (primary) hypertension: Secondary | ICD-10-CM

## 2020-03-30 HISTORY — DX: Essential (primary) hypertension: I10

## 2020-03-30 NOTE — Patient Instructions (Signed)
Medication Instructions:  No medication changes. *If you need a refill on your cardiac medications before your next appointment, please call your pharmacy*   Lab Work: Your physician recommends that you have a BMET today in the office.  If you have labs (blood work) drawn today and your tests are completely normal, you will receive your results only by: MyChart Message (if you have MyChart) OR A paper copy in the mail If you have any lab test that is abnormal or we need to change your treatment, we will call you to review the results.   Testing/Procedures: None ordered   Follow-Up: At [facility], you and your health needs are our priority.  As part of our continuing mission to provide you with exceptional heart care, we have created designated Provider Care Teams.  These Care Teams include your primary Cardiologist (physician) and Advanced Practice Providers (APPs -  Physician Assistants and Nurse Practitioners) who all work together to provide you with the care you need, when you need it.  We recommend signing up for the patient portal called "MyChart".  Sign up information is provided on this After Visit Summary.  MyChart is used to connect with patients for Virtual Visits (Telemedicine).  Patients are able to view lab/test results, encounter notes, upcoming appointments, etc.  Non-urgent messages can be sent to your provider as well.   To learn more about what you can do with MyChart, go to [email or web address].    Your next appointment:   3 month(s)  The format for your next appointment:   In Person  Provider:   Rajan Revankar, MD   Other Instructions NA  

## 2020-03-30 NOTE — Progress Notes (Signed)
Cardiology Office Note:    Date:  03/30/2020   ID:  Raymond Vance, DOB Jun 24, 1941, MRN [id number]  PCP:  Wilburn Mylar, MD  Cardiologist:  Garwin Brothers, MD   Referring MD: Wilburn Mylar, MD    ASSESSMENT:    1. Cardiomyopathy, unspecified type (HCC)   2. Essential hypertension   3. Tobacco user    PLAN:    In order of problems listed above:  1. Advanced cardiomyopathy: Congestive heart failure education was given patient is doing well with regular weighing and his wife is very supportive.  Also his salt intake issues were discussed and he is cognizant about it.  Again he is not keen on any evaluation such as EP evaluation for defibrillator placement and I respect his wishes. 2. Essential hypertension: Blood pressure is stable patient is on multiple medications and will have a Chem-7 today.  Recent blood work reports were discussed with the patient at length. 3. Mixed dyslipidemia: Lipids reviewed diet was emphasized 4. Cigarette smoker: I spent 5 minutes with the patient discussing solely about smoking. Smoking cessation was counseled. I suggested to the patient also different medications and pharmacological interventions. Patient is keen to try stopping on its own at this time. He will get back to me if he needs any further assistance in this matter. 5. Patient will be seen in follow-up appointment in 3 months or earlier if the patient has any concerns    Medication Adjustments/Labs and Tests Ordered: Current medicines are reviewed at length with the patient today.  Concerns regarding medicines are outlined above.  Orders Placed This Encounter  Procedures  . Basic Metabolic Panel (BMET)   No orders of the defined types were placed in this encounter.    Chief Complaint  Patient presents with  . Follow-up     History of Present Illness:    Raymond Vance is a 79 y.o. male.  Patient has past medical history of severe advanced cardiomyopathy.  He has history of  essential hypertension and stroke.  He also has dyslipidemia.  He denies any problems at this time and takes care of activities of daily living.  No chest pain orthopnea or PND.  He weighs himself on a regular basis.  Unfortunately continues to smoke.  At the time of my evaluation, the patient is alert awake oriented and in no distress.  Past Medical History:  Diagnosis Date  . COPD (chronic obstructive pulmonary disease) (HCC)   . Hypertension   . STEMI (ST elevation myocardial infarction) [facility])     Past Surgical History:  Procedure Laterality Date  . CATARACT EXTRACTION    . CORONARY ARTERY BYPASS GRAFT      Current Medications: Current Meds  Medication Sig  . albuterol (VENTOLIN HFA) 108 (90 Base) MCG/ACT inhaler Inhale 2 puffs into the lungs every 6 (six) hours as needed for wheezing or shortness of breath.  Marland Kitchen apixaban (ELIQUIS) 5 MG TABS tablet Take 1 tablet (5 mg total) by mouth 2 (two) times daily.  Marland Kitchen aspirin 81 MG EC tablet Take 81 mg by mouth daily.  Marland Kitchen atorvastatin (LIPITOR) 40 MG tablet Take 40 mg by mouth daily.  . budesonide-formoterol (SYMBICORT) 160-4.5 MCG/ACT inhaler Inhale 2 puffs into the lungs 2 (two) times daily.  . hydrochlorothiazide (HYDRODIURIL) 25 MG tablet Take 0.5 tablets (12.5 mg total) by mouth daily.  . metoprolol tartrate (LOPRESSOR) 50 MG tablet Take 0.5 tablets (25 mg total) by mouth daily.  Marland Kitchen omeprazole (PRILOSEC) 40 MG capsule  Take 40 mg by mouth daily.  . sacubitril-valsartan (ENTRESTO) 24-26 MG Take 1 tablet by mouth 2 (two) times daily.     Allergies:   Pneumococcal vaccine   Social History   Socioeconomic History  . Marital status: Married    Spouse name: Not on file  . Number of children: Not on file  . Years of education: Not on file  . Highest education level: Not on file  Occupational History  . Not on file  Tobacco Use  . Smoking status: Current Every Day Smoker  . Smokeless tobacco: Never Used  Substance and Sexual Activity  .  Alcohol use: Never  . Drug use: Never  . Sexual activity: Not on file  Other Topics Concern  . Not on file  Social History Narrative  . Not on file   Social Determinants of Health   Financial Resource Strain:   . Difficulty of Paying Living Expenses:   Food Insecurity:   . Worried About Charity fundraiser in the Last Year:   . Arboriculturist in the Last Year:   Transportation Needs:   . Film/video editor (Medical):   Marland Kitchen Lack of Transportation (Non-Medical):   Physical Activity:   . Days of Exercise per Week:   . Minutes of Exercise per Session:   Stress:   . Feeling of Stress :   Social Connections:   . Frequency of Communication with Friends and Family:   . Frequency of Social Gatherings with Friends and Family:   . Attends Religious Services:   . Active Member of Clubs or Organizations:   . Attends Archivist Meetings:   Marland Kitchen Marital Status:      Family History: The patient's Family history is unknown by patient.  ROS:   Please see the history of present illness.    All other systems reviewed and are negative.  EKGs/Labs/Other Studies Reviewed:    The following studies were reviewed today: I discussed my findings with the patient at length including blood work   Recent Labs: 02/21/2020: ALT 19; BUN 20; Creatinine, Ser 1.24; Hemoglobin 17.4; Platelets 192; Potassium 4.1; Sodium 140; TSH 1.760  Recent Lipid Panel    Component Value Date/Time   CHOL 151 02/21/2020 0951   TRIG 92 02/21/2020 0951   HDL 60 02/21/2020 0951   CHOLHDL 2.5 02/21/2020 0951   CHOLHDL 3.6 12/19/2019 0332   VLDL 17 12/19/2019 0332   LDLCALC 74 02/21/2020 0951    Physical Exam:    VS:  BP 112/72   Pulse 66   Ht 5\' 11"  (1.803 m)   Wt 217 lb (98.4 kg)   SpO2 97%   BMI 30.27 kg/m     Wt Readings from Last 3 Encounters:  03/30/20 217 lb (98.4 kg)  02/21/20 216 lb (98 kg)  01/13/20 215 lb (97.5 kg)     GEN: Patient is in no acute distress HEENT: Normal NECK: No  JVD; No carotid bruits LYMPHATICS: No lymphadenopathy CARDIAC: Hear sounds regular, 2/6 systolic murmur at the apex. RESPIRATORY:  Clear to auscultation without rales, wheezing or rhonchi  ABDOMEN: Soft, non-tender, non-distended MUSCULOSKELETAL:  No edema; No deformity  SKIN: Warm and dry NEUROLOGIC:  Alert and oriented x 3 PSYCHIATRIC:  Normal affect   Signed, Jenean Lindau, MD  03/30/2020 9:51 AM    [place]

## 2020-03-31 LAB — BASIC METABOLIC PANEL
BUN/Creatinine Ratio: 16 (ref 10–24)
BUN: 16 mg/dL (ref 8–27)
CO2: 22 mmol/L (ref 20–29)
Calcium: 9.7 mg/dL (ref 8.6–10.2)
Chloride: 101 mmol/L (ref 96–106)
Creatinine, Ser: 1.02 mg/dL (ref 0.76–1.27)
GFR calc Af Amer: 80 mL/min/{1.73_m2} (ref >59)
GFR calc non Af Amer: 69 mL/min/{1.73_m2} (ref >59)
Glucose: 103 mg/dL — ABNORMAL HIGH (ref 65–99)
Potassium: 4.2 mmol/L (ref 3.5–5.2)
Sodium: 138 mmol/L (ref 134–144)

## 2020-04-14 ENCOUNTER — Encounter: Payer: Self-pay | Admitting: Cardiology

## 2020-05-11 ENCOUNTER — Other Ambulatory Visit: Payer: Self-pay | Admitting: Cardiology

## 2020-06-21 ENCOUNTER — Ambulatory Visit: Payer: Medicare HMO | Admitting: Cardiology

## 2020-06-27 ENCOUNTER — Other Ambulatory Visit: Payer: Self-pay | Admitting: Cardiology

## 2020-06-27 MED ORDER — APIXABAN 5 MG PO TABS: 5.0000 mg | ORAL_TABLET | Freq: Two times a day (BID) | ORAL | 3 refills | Status: DC

## 2020-06-27 NOTE — Telephone Encounter (Signed)
Refill sent in per request.  

## 2020-06-27 NOTE — Telephone Encounter (Signed)
*  STAT* If patient is at the pharmacy, call can be transferred to refill team.   1. Which medications need to be refilled? (please list name of each medication and dose if known)? apixaban (ELIQUIS) 5 MG TABS tablet  2. Which pharmacy/location (including street and city if local pharmacy) is medication to be sent to? [id number] - [place], [place] - [address], [address] [facility] [address]  3. Do they need a 30 day or 90 day supply? 90 day

## 2020-07-16 ENCOUNTER — Encounter: Payer: Self-pay | Admitting: Cardiology

## 2020-07-16 ENCOUNTER — Other Ambulatory Visit: Payer: Self-pay

## 2020-07-16 ENCOUNTER — Ambulatory Visit: Payer: Medicare HMO | Admitting: Cardiology

## 2020-07-16 VITALS — BP 104/80 | HR 56 | Ht 71.0 in | Wt 213.0 lb

## 2020-07-16 DIAGNOSIS — Z951 Presence of aortocoronary bypass graft: Secondary | ICD-10-CM

## 2020-07-16 DIAGNOSIS — I10 Essential (primary) hypertension: Secondary | ICD-10-CM

## 2020-07-16 DIAGNOSIS — E782 Mixed hyperlipidemia: Secondary | ICD-10-CM

## 2020-07-16 DIAGNOSIS — I429 Cardiomyopathy, unspecified: Secondary | ICD-10-CM | POA: Diagnosis not present

## 2020-07-16 DIAGNOSIS — F17210 Nicotine dependence, cigarettes, uncomplicated: Secondary | ICD-10-CM

## 2020-07-16 HISTORY — DX: Mixed hyperlipidemia: E78.2

## 2020-07-16 HISTORY — DX: Nicotine dependence, cigarettes, uncomplicated: F17.210

## 2020-07-16 HISTORY — DX: Presence of aortocoronary bypass graft: Z95.1

## 2020-07-16 IMAGING — CT CT HEAD CODE STROKE
4 series · 15 of 47 positions shown, 17 images · non-contrast
Comparison: None.

CLINICAL DATA: Code stroke.  Confusion and aphasia

EXAM:
CT HEAD WITHOUT CONTRAST
TECHNIQUE: Contiguous axial images were obtained from the base of the skull
through the vertex without intravenous contrast.

[Series 3: head wo · axial · 0.50mm/px · z∈[-144,-20]mm · 7 of 35 slices shown, 9 images]
[im 5/35  brain]
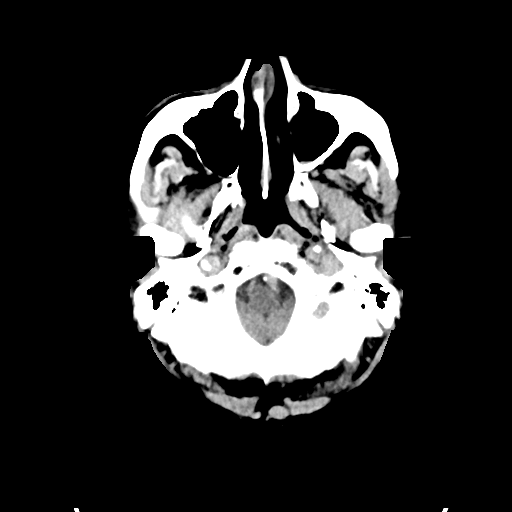
[im 5/35  bone]
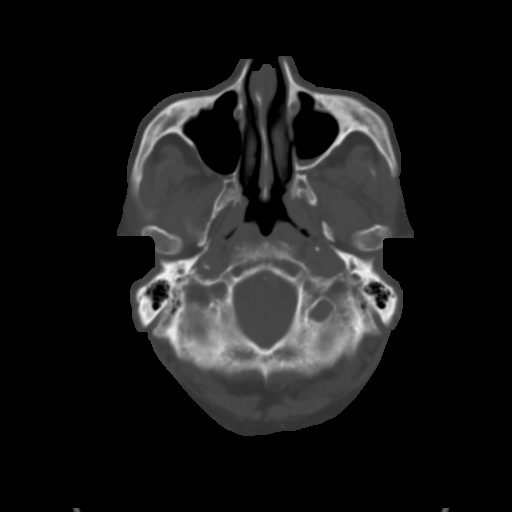
[im 9/35  brain]
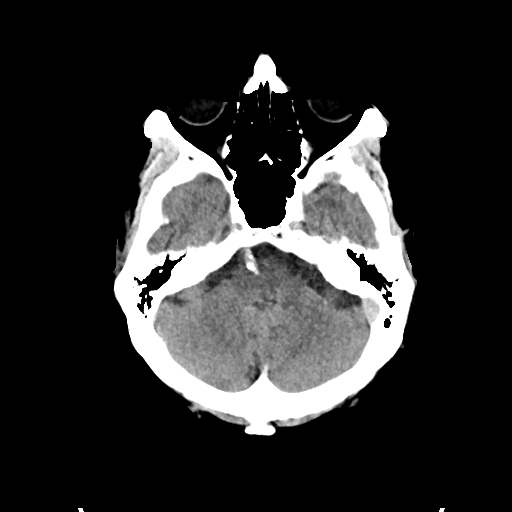
[im 13/35  brain]
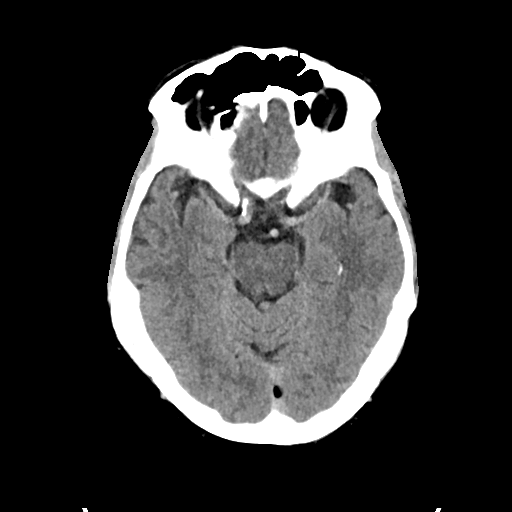
[im 18/35  brain]
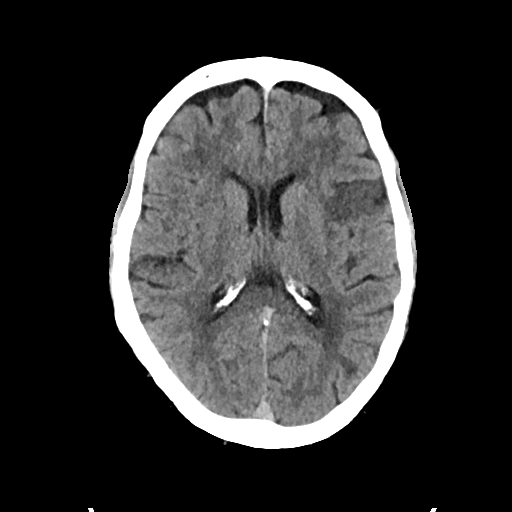
[im 22/35  brain]
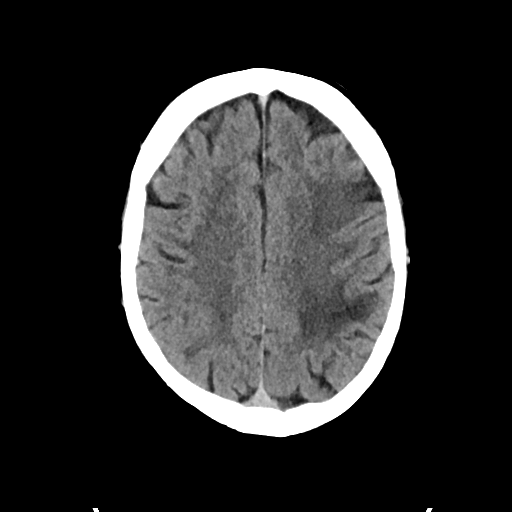
[im 22/35  bone]
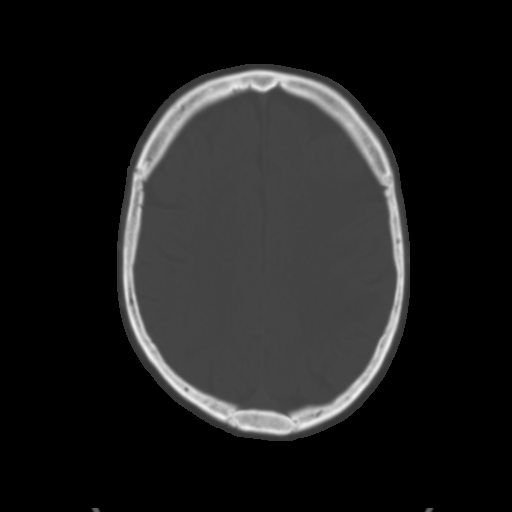
[im 26/35  brain]
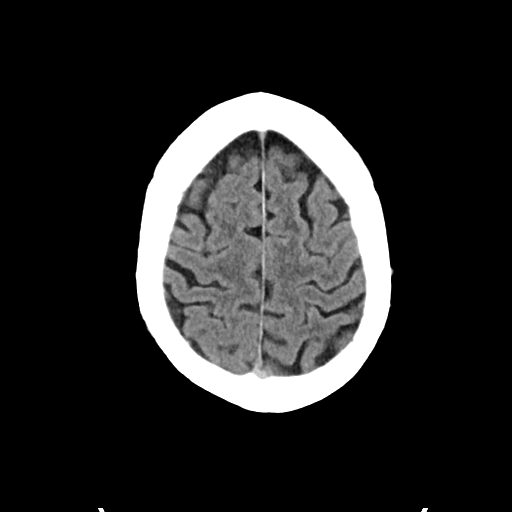
[im 30/35  brain]
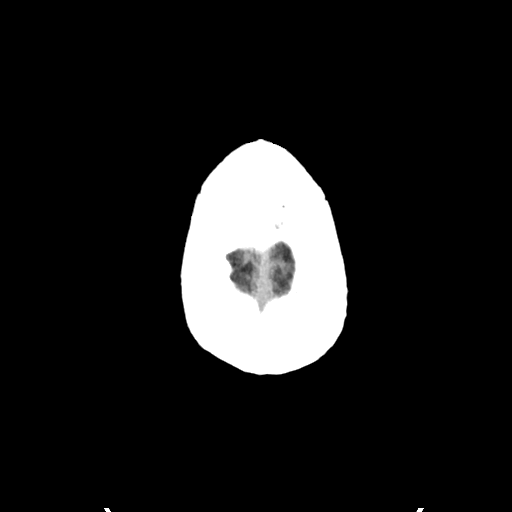

[Series 4: head bone · axial · 0.50mm/px · z∈[-148,-130]mm · 2 of 87 slices shown]
[im 9/87  bone]
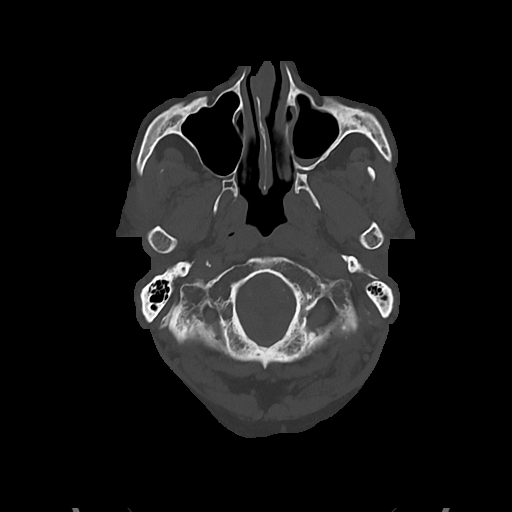
[im 18/87  bone]
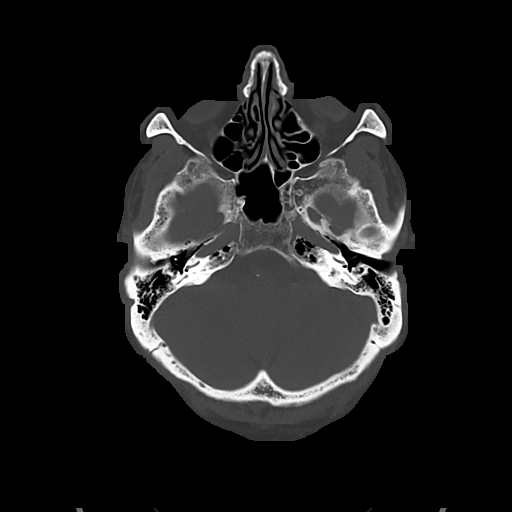

[Series 5: cor soft · coronal · 0.32mm/px · 3 of 73 slices shown]
[im 25/73  brain]
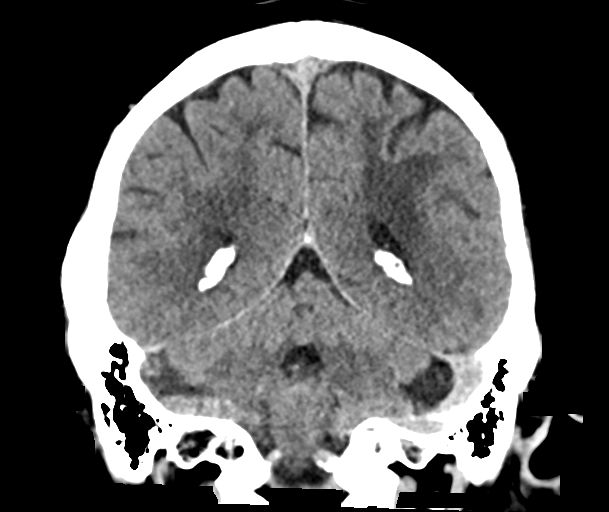
[im 33/73  brain]
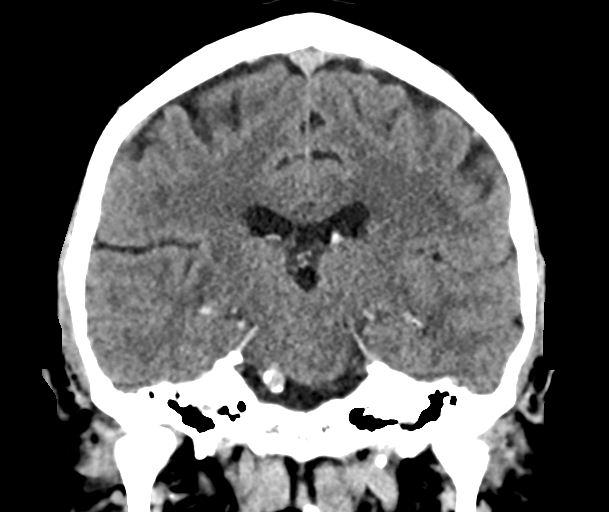
[im 41/73  brain]
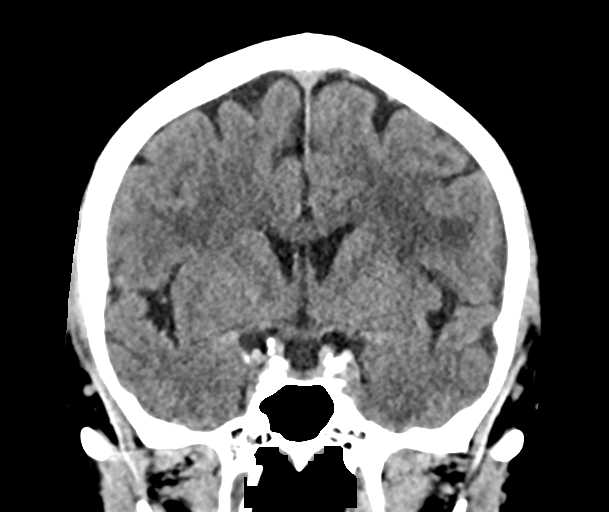

[Series 6: sag soft · sagittal · 0.32mm/px · 3 of 66 slices shown]
[im 22/66  brain]
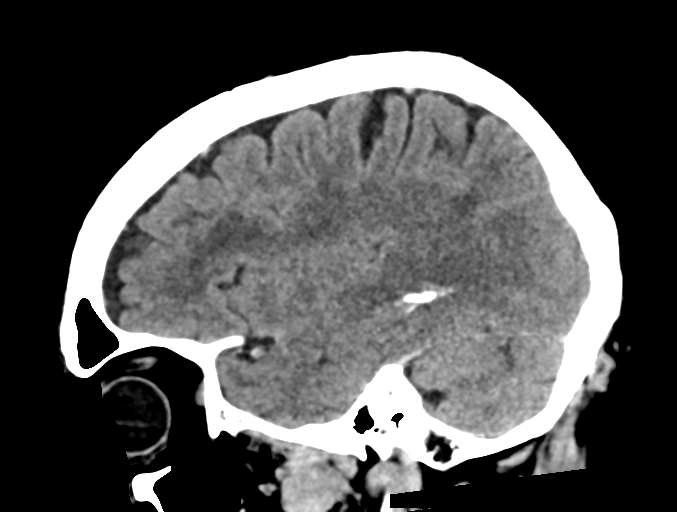
[im 33/66  brain]
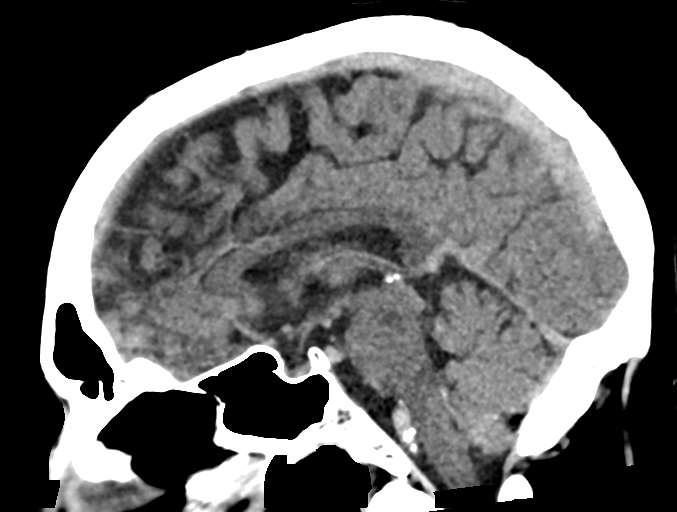
[im 44/66  brain]
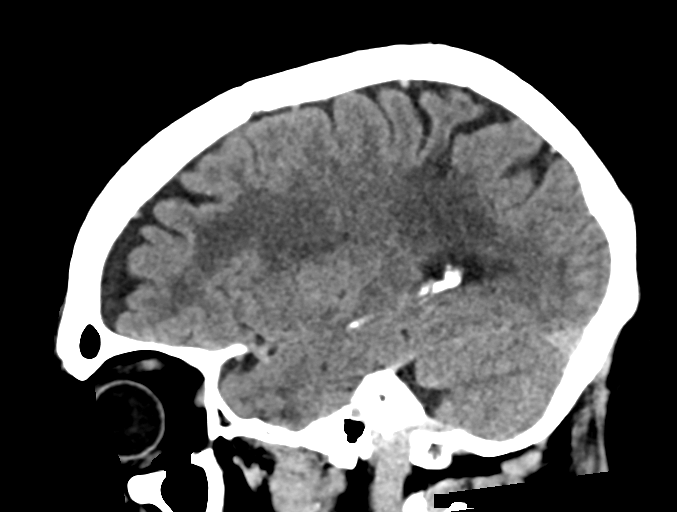

[15 of 47 positions shown; findings below may reference images not displayed]

FINDINGS: Brain: There is no mass, hemorrhage or extra-axial collection. The
size and configuration of the ventricles and extra-axial CSF spaces
are normal. There is hypoattenuation of the periventricular white
matter, most commonly indicating chronic ischemic microangiopathy.
There is an area of acute or early subacute ischemia within the
anterior left MCA distribution, involving the left frontal
operculum. There are old left parietal and temporal infarcts.

Vascular: No abnormal hyperdensity of the major intracranial
arteries or dural venous sinuses. No intracranial atherosclerosis.

Skull: The visualized skull base, calvarium and extracranial soft
tissues are normal.

Sinuses/Orbits: Moderate left maxillary sinus mucosal thickening.
The orbits are normal.

ASPECTS (Alberta Stroke Program Early CT Score)

- Ganglionic level infarction (caudate, lentiform nuclei, internal
capsule, insula, M1-M3 cortex): 6

- Supraganglionic infarction (M4-M6 cortex): 2

Total score (0-10 with 10 being normal): 8
IMPRESSION: 1. Acute or early subacute ischemia of the anterior left MCA
distribution, involving the left frontal operculum.
2. No hemorrhage.
3. Multiple old infarcts and advanced chronic ischemic
microangiopathy.
4. ASPECTS is 8.
5. These results were communicated to Dr. Nola Oxendine at [DATE]
on 12/19/2019 by text page via the [employer] messaging system.

## 2020-07-16 NOTE — Patient Instructions (Signed)
Medication Instructions:  Your physician recommends that you continue on your current medications as directed. Please refer to the Current Medication list given to you today.  *If you need a refill on your cardiac medications before your next appointment, please call your pharmacy*   Lab Work: TODAY:  BMET  If you have labs (blood work) drawn today and your tests are completely normal, you will receive your results only by: Marland Kitchen MyChart Message (if you have MyChart) OR . A paper copy in the mail If you have any lab test that is abnormal or we need to change your treatment, we will call you to review the results.   Testing/Procedures: Your physician has requested that you have an echocardiogram. Echocardiography is a painless test that uses sound waves to create images of your heart. It provides your doctor with information about the size and shape of your heart and how well your heart's chambers and valves are working. This procedure takes approximately one hour. There are no restrictions for this procedure.    Follow-Up: At [facility], you and your health needs are our priority.  As part of our continuing mission to provide you with exceptional heart care, we have created designated Provider Care Teams.  These Care Teams include your primary Cardiologist (physician) and Advanced Practice Providers (APPs -  Physician Assistants and Nurse Practitioners) who all work together to provide you with the care you need, when you need it.  We recommend signing up for the patient portal called "MyChart".  Sign up information is provided on this After Visit Summary.  MyChart is used to connect with patients for Virtual Visits (Telemedicine).  Patients are able to view lab/test results, encounter notes, upcoming appointments, etc.  Non-urgent messages can be sent to your provider as well.   To learn more about what you can do with MyChart, go to [email or web address].    Your next appointment:   6  month(s)  The format for your next appointment:   In Person  Provider:   Belva Crome, MD   Other Instructions  Echocardiogram An echocardiogram is a procedure that uses painless sound waves (ultrasound) to produce an image of the heart. Images from an echocardiogram can provide important information about:  Signs of coronary artery disease (CAD).  Aneurysm detection. An aneurysm is a weak or damaged part of an artery wall that bulges out from the normal force of blood pumping through the body.  Heart size and shape. Changes in the size or shape of the heart can be associated with certain conditions, including heart failure, aneurysm, and CAD.  Heart muscle function.  Heart valve function.  Signs of a past heart attack.  Fluid buildup around the heart.  Thickening of the heart muscle.  A tumor or infectious growth around the heart valves. Tell a health care provider about:  Any allergies you have.  All medicines you are taking, including vitamins, herbs, eye drops, creams, and over-the-counter medicines.  Any blood disorders you have.  Any surgeries you have had.  Any medical conditions you have.  Whether you are pregnant or may be pregnant. What are the risks? Generally, this is a safe procedure. However, problems may occur, including:  Allergic reaction to dye (contrast) that may be used during the procedure. What happens before the procedure? No specific preparation is needed. You may eat and drink normally. What happens during the procedure?   An IV tube may be inserted into one of your veins.  You may  receive contrast through this tube. A contrast is an injection that improves the quality of the pictures from your heart.  A gel will be applied to your chest.  A wand-like tool (transducer) will be moved over your chest. The gel will help to transmit the sound waves from the transducer.  The sound waves will harmlessly bounce off of your heart to  allow the heart images to be captured in real-time motion. The images will be recorded on a computer. The procedure may vary among health care providers and hospitals. What happens after the procedure?  You may return to your normal, everyday life, including diet, activities, and medicines, unless your health care provider tells you not to do that. Summary  An echocardiogram is a procedure that uses painless sound waves (ultrasound) to produce an image of the heart.  Images from an echocardiogram can provide important information about the size and shape of your heart, heart muscle function, heart valve function, and fluid buildup around your heart.  You do not need to do anything to prepare before this procedure. You may eat and drink normally.  After the echocardiogram is completed, you may return to your normal, everyday life, unless your health care provider tells you not to do that. This information is not intended to replace advice given to you by your health care provider. Make sure you discuss any questions you have with your health care provider. Document Revised: 03/31/2019 Document Reviewed: 01/10/2017 Elsevier Patient Education  [place].

## 2020-07-16 NOTE — Progress Notes (Signed)
Cardiology Office Note:    Date:  07/16/2020   ID:  Raymond Vance, DOB January 14, 1941, MRN [id number]  PCP:  Raymond Cho., MD  Cardiologist:  Raymond Brothers, MD   Referring MD: Raymond Mylar, MD    ASSESSMENT:    1. Cardiomyopathy, unspecified type (HCC)   2. Essential hypertension   3. Cigarette smoker   4. Mixed dyslipidemia   5. Hx of CABG    PLAN:    In order of problems listed above:  1. Coronary artery disease: Secondary prevention stressed.  Importance of compliance with diet medication stressed and she vocalized understanding. 2. Essential hypertension: Blood pressure stable 3. History of coronary artery disease, post bypass surgery and cardiomyopathy: I discussed my findings with the patient extensively.  Congestive heart failure education was given to the patient.  He is tolerating Entresto well and we will have a Chem-7 done today.  Echocardiogram will be done for follow-up.  He is not keen on EP evaluation as discussed above.  This was reemphasized today again.  Importance stressed and he understands. 4. Cigarette smoker.  I spent 5 minutes with the patient discussing solely about smoking. Smoking cessation was counseled. I suggested to the patient also different medications and pharmacological interventions. Patient is keen to try stopping on its own at this time. He will get back to me if he needs any further assistance in this matter. 5. Patient will be seen in follow-up appointment in 3 months or earlier if the patient has any concerns    Medication Adjustments/Labs and Tests Ordered: Current medicines are reviewed at length with the patient today.  Concerns regarding medicines are outlined above.  Orders Placed This Encounter  Procedures  . Basic metabolic panel  . ECHOCARDIOGRAM COMPLETE   No orders of the defined types were placed in this encounter.    Chief Complaint  Patient presents with  . Follow-up     History of Present Illness:    Raymond  Vance is a 79 y.o. male.  Patient has past medical history of coronary artery disease post CABG surgery, advanced cardiomyopathy which is chronic, essential hypertension dyslipidemia and an active smoker.  He denies any problems at this time and he mentions to me that he is feeling much better since he seen me for a couple of times.  He is on anticoagulation for stroke related issues.  This is managed by his primary care physician.  At the time of my evaluation, the patient is alert awake oriented and in no distress.  Past Medical History:  Diagnosis Date  . COPD (chronic obstructive pulmonary disease) (HCC)   . Hypertension   . STEMI (ST elevation myocardial infarction) [facility])     Past Surgical History:  Procedure Laterality Date  . CATARACT EXTRACTION    . CORONARY ARTERY BYPASS GRAFT      Current Medications: Current Meds  Medication Sig  . albuterol (VENTOLIN HFA) 108 (90 Base) MCG/ACT inhaler Inhale 2 puffs into the lungs every 6 (six) hours as needed for wheezing or shortness of breath.  Marland Kitchen apixaban (ELIQUIS) 5 MG TABS tablet Take 1 tablet (5 mg total) by mouth 2 (two) times daily.  Marland Kitchen aspirin 81 MG EC tablet Take 81 mg by mouth daily.  Marland Kitchen atorvastatin (LIPITOR) 40 MG tablet Take 40 mg by mouth daily.  . budesonide-formoterol (SYMBICORT) 160-4.5 MCG/ACT inhaler Inhale 2 puffs into the lungs 2 (two) times daily.  Marland Kitchen ENTRESTO 24-26 MG TAKE 1 TABLET BY MOUTH TWICE  A DAY  . hydrochlorothiazide (HYDRODIURIL) 25 MG tablet Take 0.5 tablets (12.5 mg total) by mouth daily.  . metoprolol tartrate (LOPRESSOR) 50 MG tablet Take 0.5 tablets (25 mg total) by mouth daily.  Marland Kitchen omeprazole (PRILOSEC) 40 MG capsule Take 40 mg by mouth daily.     Allergies:   Pneumococcal vaccine   Social History   Socioeconomic History  . Marital status: Married    Spouse name: Not on file  . Number of children: Not on file  . Years of education: Not on file  . Highest education level: Not on file  Occupational  History  . Not on file  Tobacco Use  . Smoking status: Current Every Day Smoker  . Smokeless tobacco: Never Used  Substance and Sexual Activity  . Alcohol use: Never  . Drug use: Never  . Sexual activity: Not on file  Other Topics Concern  . Not on file  Social History Narrative  . Not on file   Social Determinants of Health   Financial Resource Strain:   . Difficulty of Paying Living Expenses:   Food Insecurity:   . Worried About Programme researcher, broadcasting/film/video in the Last Year:   . Barista in the Last Year:   Transportation Needs:   . Freight forwarder (Medical):   Marland Kitchen Lack of Transportation (Non-Medical):   Physical Activity:   . Days of Exercise per Week:   . Minutes of Exercise per Session:   Stress:   . Feeling of Stress :   Social Connections:   . Frequency of Communication with Friends and Family:   . Frequency of Social Gatherings with Friends and Family:   . Attends Religious Services:   . Active Member of Clubs or Organizations:   . Attends Banker Meetings:   Marland Kitchen Marital Status:      Family History: The patient's Family history is unknown by patient.  ROS:   Please see the history of present illness.    All other systems reviewed and are negative.  EKGs/Labs/Other Studies Reviewed:    The following studies were reviewed today: IMPRESSIONS    1. Left ventricular ejection fraction, by visual estimation, is 20 to  25%. The left ventricle has severely decreased function. There is  moderately increased left ventricular hypertrophy.  2. Definity contrast agent was given IV to delineate the left ventricular  endocardial borders. No mural thrombus was noted.  3. Left ventricular diastolic function could not be evaluated.  4. The left ventricle demonstrates global hypokinesis.  5. Global right ventricle has normal systolic function.The right  ventricular size is normal. No increase in right ventricular wall  thickness.  6. Left atrial  size was moderately dilated.  7. Right atrial size was mildly dilated.  8. The mitral valve is grossly normal. Trivial mitral valve  regurgitation.  9. The tricuspid valve is grossly normal.  10. The aortic valve is tricuspid. Aortic valve regurgitation is trivial.  Mild aortic valve sclerosis without stenosis.  11. The pulmonic valve was not well visualized. Pulmonic valve  regurgitation is not visualized.  12. Aortic dilatation noted.  13. 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    Recent Labs: 02/21/2020: ALT 19; Hemoglobin 17.4; Platelets 192; TSH 1.760 03/30/2020: BUN 16; Creatinine, Ser 1.02; Potassium 4.2; Sodium 138  Recent Lipid Panel    Component Value Date/Time   CHOL 151 02/21/2020 0951   TRIG 92 02/21/2020 0951   HDL 60  02/21/2020 0951   CHOLHDL 2.5 02/21/2020 0951   CHOLHDL 3.6 12/19/2019 0332   VLDL 17 12/19/2019 0332   LDLCALC 74 02/21/2020 0951    Physical Exam:    VS:  BP 104/80 (BP Location: Right Arm, Patient Position: Sitting, Cuff Size: Normal)   Pulse 56   Ht 5\' 11"  (1.803 m)   Wt (!) 213 lb (96.6 kg)   SpO2 94%   BMI 29.71 kg/m     Wt Readings from Last 3 Encounters:  07/16/20 (!) 213 lb (96.6 kg)  03/30/20 217 lb (98.4 kg)  02/21/20 216 lb (98 kg)     GEN: Patient is in no acute distress HEENT: Normal NECK: No JVD; No carotid bruits LYMPHATICS: No lymphadenopathy CARDIAC: Hear sounds regular, 2/6 systolic murmur at the apex. RESPIRATORY:  Clear to auscultation without rales, wheezing or rhonchi  ABDOMEN: Soft, non-tender, non-distended MUSCULOSKELETAL:  No edema; No deformity  SKIN: Warm and dry NEUROLOGIC:  Alert and oriented x 3 PSYCHIATRIC:  Normal affect   Signed, 04/22/20, MD  07/16/2020 11:05 AM    [facility]

## 2020-07-17 LAB — BASIC METABOLIC PANEL
BUN/Creatinine Ratio: 16 (ref 10–24)
BUN: 18 mg/dL (ref 8–27)
CO2: 25 mmol/L (ref 20–29)
Calcium: 9.3 mg/dL (ref 8.6–10.2)
Chloride: 100 mmol/L (ref 96–106)
Creatinine, Ser: 1.16 mg/dL (ref 0.76–1.27)
GFR calc Af Amer: 68 mL/min/{1.73_m2} (ref >59)
GFR calc non Af Amer: 59 mL/min/{1.73_m2} — ABNORMAL LOW (ref >59)
Glucose: 119 mg/dL — ABNORMAL HIGH (ref 65–99)
Potassium: 4.3 mmol/L (ref 3.5–5.2)
Sodium: 140 mmol/L (ref 134–144)

## 2020-07-26 ENCOUNTER — Telehealth: Payer: Self-pay | Admitting: Cardiology

## 2020-07-26 MED ORDER — NITROGLYCERIN 0.4 MG SL SUBL: 0.4000 mg | SUBLINGUAL_TABLET | SUBLINGUAL | 6 refills | Status: AC | PRN

## 2020-07-26 NOTE — Telephone Encounter (Signed)
RX sent to [facility] as per pt request. Pt made aware.

## 2020-07-26 NOTE — Telephone Encounter (Signed)
   Pt c/o medication issue:  1. Name of Medication: nitroglycerin  2. How are you currently taking this medication (dosage and times per day)?   3. Are you having a reaction (difficulty breathing--STAT)?   4. What is your medication issue? Pt's wife called he said during pt's appt with Dr. Tomie China, he prescribed him nitroglycerin they would like to call the [facility] to [id number] - [place], [place] - [address], [address] AT [facility] [address]

## 2020-08-12 DIAGNOSIS — R55 Syncope and collapse: Secondary | ICD-10-CM | POA: Insufficient documentation

## 2020-08-12 HISTORY — DX: Syncope and collapse: R55

## 2020-08-15 DIAGNOSIS — Z95810 Presence of automatic (implantable) cardiac defibrillator: Secondary | ICD-10-CM | POA: Insufficient documentation

## 2020-08-15 HISTORY — DX: Presence of automatic (implantable) cardiac defibrillator: Z95.810

## 2020-08-17 ENCOUNTER — Ambulatory Visit (HOSPITAL_BASED_OUTPATIENT_CLINIC_OR_DEPARTMENT_OTHER): Admission: RE | Admit: 2020-08-17 | Payer: Medicare HMO | Source: Ambulatory Visit

## 2020-09-12 ENCOUNTER — Encounter: Payer: Self-pay | Admitting: Cardiology

## 2020-09-12 ENCOUNTER — Ambulatory Visit: Payer: Medicare HMO | Admitting: Cardiology

## 2020-09-12 ENCOUNTER — Other Ambulatory Visit: Payer: Self-pay

## 2020-09-12 VITALS — BP 122/78 | HR 80 | Ht 71.0 in | Wt 210.0 lb

## 2020-09-12 DIAGNOSIS — E782 Mixed hyperlipidemia: Secondary | ICD-10-CM

## 2020-09-12 DIAGNOSIS — F17210 Nicotine dependence, cigarettes, uncomplicated: Secondary | ICD-10-CM

## 2020-09-12 DIAGNOSIS — I10 Essential (primary) hypertension: Secondary | ICD-10-CM | POA: Diagnosis not present

## 2020-09-12 DIAGNOSIS — Z95810 Presence of automatic (implantable) cardiac defibrillator: Secondary | ICD-10-CM

## 2020-09-12 DIAGNOSIS — Z951 Presence of aortocoronary bypass graft: Secondary | ICD-10-CM

## 2020-09-12 DIAGNOSIS — I429 Cardiomyopathy, unspecified: Secondary | ICD-10-CM

## 2020-09-12 MED ORDER — FUROSEMIDE 40 MG PO TABS
40.0000 mg | ORAL_TABLET | Freq: Every day | ORAL | 3 refills | Status: DC
Start: 2020-09-12 — End: 2021-06-28

## 2020-09-12 NOTE — Patient Instructions (Signed)

## 2020-09-12 NOTE — Progress Notes (Signed)
Cardiology Office Note:    Date:  09/12/2020   ID:  Raymond Vance, DOB [birth year], MRN [id number]  PCP:  Raymond Cho., MD  Cardiologist:  Raymond Brothers, MD   Referring MD: Raymond Cho., MD    ASSESSMENT:    1. Cardiomyopathy, unspecified type (HCC)   2. Essential hypertension   3. Biventricular automatic implantable cardioverter defibrillator in situ   4. Cigarette smoker   5. Hx of CABG   6. Mixed dyslipidemia    PLAN:    In order of problems listed above:  1. Advanced cardiomyopathy: I discussed my findings with the patient at extensive length.  Dietary issues were discussed.  He vocalized understanding.  Congestive heart failure issues were discussed.  The patient was advised to exercise on a regular basis.  He promises to do so. 2. Intracardiac defibrillator: Patient was advised about it being in the past but was reluctant.  Now he has a defibrillator.  He is awaiting his monitor to be mailed by the manufacturer.  Subsequently we will set him up with our electrophysiology colleagues and he is keen on getting all his care under [facility]. 3. Congestive heart failure: As mentioned above.  We will do a Chem-7 and reactive fact that he is on multiple medications.  Diet was emphasized. 4. Cigarette smoker: I spent 5 minutes with the patient discussing solely about smoking. Smoking cessation was counseled. I suggested to the patient also different medications and pharmacological interventions. Patient is keen to try stopping on its own at this time. He will get back to me if he needs any further assistance in this matter. 5. Patient will be seen in follow-up appointment in 3 months or earlier if the patient has any concerns    Medication Adjustments/Labs and Tests Ordered: Current medicines are reviewed at length with the patient today.  Concerns regarding medicines are outlined above.  No orders of the defined types were placed in this encounter.  No orders  of the defined types were placed in this encounter.    No chief complaint on file.    History of Present Illness:    Raymond Vance is a 79 y.o. male.  Patient has past medical history of congestive heart failure and advanced cardiomyopathy.  Patient has history of essential hypertension dyslipidemia and is an active smoker.  He went to [facility] with a fall.  Patient underwent defibrillator placement.  Subsequently is done well.  No chest pain orthopnea or PND.  He continues to smoke.  Echocardiogram from [facility] hospital revealed an ejection fraction of 15 to 20%.  At the time of my evaluation, the patient is alert awake oriented and in no distress.  Past Medical History:  Diagnosis Date  . COPD (chronic obstructive pulmonary disease) (HCC)   . Hypertension   . STEMI (ST elevation myocardial infarction) [facility])     Past Surgical History:  Procedure Laterality Date  . CATARACT EXTRACTION    . CORONARY ARTERY BYPASS GRAFT      Current Medications: Current Meds  Medication Sig  . albuterol (VENTOLIN HFA) 108 (90 Base) MCG/ACT inhaler Inhale 2 puffs into the lungs every 6 (six) hours as needed for wheezing or shortness of breath.  Marland Kitchen apixaban (ELIQUIS) 5 MG TABS tablet Take 1 tablet (5 mg total) by mouth 2 (two) times daily.  Marland Kitchen aspirin 81 MG EC tablet Take 81 mg by mouth daily.  Marland Kitchen atorvastatin (LIPITOR) 40 MG tablet  Take 40 mg by mouth daily.  . budesonide-formoterol (SYMBICORT) 160-4.5 MCG/ACT inhaler Inhale 2 puffs into the lungs 2 (two) times daily.  . cyanocobalamin 100 MCG tablet Take 500 mcg by mouth daily.  Marland Kitchen ENTRESTO 24-26 MG TAKE 1 TABLET BY MOUTH TWICE A DAY  . furosemide (LASIX) 40 MG tablet Take 40 mg by mouth daily.  . metoprolol tartrate (LOPRESSOR) 50 MG tablet Take 0.5 tablets (25 mg total) by mouth daily.  . nitroGLYCERIN (NITROSTAT) 0.4 MG SL tablet Place 1 tablet (0.4 mg total) under the tongue every 5 (five) minutes as needed.  Marland Kitchen  omeprazole (PRILOSEC) 40 MG capsule Take 40 mg by mouth daily.     Allergies:   Pneumococcal vaccine   Social History   Socioeconomic History  . Marital status: Married    Spouse name: Not on file  . Number of children: Not on file  . Years of education: Not on file  . Highest education level: Not on file  Occupational History  . Not on file  Tobacco Use  . Smoking status: Current Every Day Smoker  . Smokeless tobacco: Never Used  Substance and Sexual Activity  . Alcohol use: Never  . Drug use: Never  . Sexual activity: Not on file  Other Topics Concern  . Not on file  Social History Narrative  . Not on file   Social Determinants of Health   Financial Resource Strain:   . Difficulty of Paying Living Expenses: Not on file  Food Insecurity:   . Worried About Programme researcher, broadcasting/film/video in the Last Year: Not on file  . Ran Out of Food in the Last Year: Not on file  Transportation Needs:   . Lack of Transportation (Medical): Not on file  . Lack of Transportation (Non-Medical): Not on file  Physical Activity:   . Days of Exercise per Week: Not on file  . Minutes of Exercise per Session: Not on file  Stress:   . Feeling of Stress : Not on file  Social Connections:   . Frequency of Communication with Friends and Family: Not on file  . Frequency of Social Gatherings with Friends and Family: Not on file  . Attends Religious Services: Not on file  . Active Member of Clubs or Organizations: Not on file  . Attends Banker Meetings: Not on file  . Marital Status: Not on file     Family History: The patient's Family history is unknown by patient.  ROS:   Please see the history of present illness.    All other systems reviewed and are negative.  EKGs/Labs/Other Studies Reviewed:    The following studies were reviewed today: Name: Raymond Vance        Study Date: 08/13/2020    Height: 70 in  MRN: [id number]             Patient [facility]   Weight: 210 lb  DOB: November 20, 1941           Gender: Male        BSA:  2.1 m2  Age: 25 yrs             Ethnicity: W        BP:  120/59 mmHg  Reason For Study: lv function; Altered mental status,  unspecified altered mental status type; Congestive heart  failure, unspecified HF chronicity, unspecified heart failure  HR: 59  type [facility]  Ordering Physician: Dimas Alexandria Performed By: Lysbeth Galas  Referring Physician:  SELF, A REFERRAL   -  -  PROCEDURE  A two-dimensional transthoracic echocardiogram with color flow and  Doppler was performed. Image Quality: Poor. A injection of Definity  contrast agent was performed to improve image quality.  -  SUMMARY  Mild left ventricular hypertrophy  There is severe global hypokinesis of the left ventricle.  LV ejection fraction = 15-20%.  The left atrium is moderately dilated.  There is trace aortic regurgitation.  There is mild tricuspid regurgitation.  There is no pericardial effusion.  Compared to the last study report EF has decreased     Recent Labs: 02/21/2020: ALT 19; Hemoglobin 17.4; Platelets 192; TSH 1.760 07/16/2020: BUN 18; Creatinine, Ser 1.16; Potassium 4.3; Sodium 140  Recent Lipid Panel    Component Value Date/Time   CHOL 151 02/21/2020 0951   TRIG 92 02/21/2020 0951   HDL 60 02/21/2020 0951   CHOLHDL 2.5 02/21/2020 0951   CHOLHDL 3.6 12/19/2019 0332   VLDL 17 12/19/2019 0332   LDLCALC 74 02/21/2020 0951    Physical Exam:    VS:  BP 122/78   Pulse 80   Ht 5\' 11"  (1.803 m)   Wt 210 lb 0.6 oz (95.3 kg)   SpO2 97%   BMI 29.29 kg/m     Wt Readings from Last 3 Encounters:  09/12/20 210 lb 0.6 oz (95.3 kg)  07/16/20 (!) 213 lb (96.6 kg)  03/30/20 217 lb (98.4 kg)     GEN: Patient is in no acute distress HEENT: Normal NECK: No JVD; No carotid bruits LYMPHATICS: No lymphadenopathy CARDIAC: Hear sounds regular, 2/6 systolic murmur at the apex. RESPIRATORY:  Clear to  auscultation without rales, wheezing or rhonchi  ABDOMEN: Soft, non-tender, non-distended MUSCULOSKELETAL:  No edema; No deformity  SKIN: Warm and dry NEUROLOGIC:  Alert and oriented x 3 PSYCHIATRIC:  Normal affect   Signed, 05/30/20, MD  09/12/2020 9:46 AM    [facility]

## 2020-09-13 LAB — BASIC METABOLIC PANEL
BUN/Creatinine Ratio: 16 (ref 10–24)
BUN: 16 mg/dL (ref 8–27)
CO2: 24 mmol/L (ref 20–29)
Calcium: 9.5 mg/dL (ref 8.6–10.2)
Chloride: 105 mmol/L (ref 96–106)
Creatinine, Ser: 1.01 mg/dL (ref 0.76–1.27)
GFR calc Af Amer: 81 mL/min/{1.73_m2} (ref >59)
GFR calc non Af Amer: 70 mL/min/{1.73_m2} (ref >59)
Glucose: 107 mg/dL — ABNORMAL HIGH (ref 65–99)
Potassium: 3.9 mmol/L (ref 3.5–5.2)
Sodium: 142 mmol/L (ref 134–144)

## 2020-09-17 ENCOUNTER — Other Ambulatory Visit: Payer: Self-pay

## 2020-09-17 ENCOUNTER — Ambulatory Visit (INDEPENDENT_AMBULATORY_CARE_PROVIDER_SITE_OTHER): Payer: Medicare HMO | Admitting: Cardiology

## 2020-09-17 ENCOUNTER — Encounter: Payer: Self-pay | Admitting: Cardiology

## 2020-09-17 ENCOUNTER — Other Ambulatory Visit: Payer: Self-pay | Admitting: Cardiology

## 2020-09-17 VITALS — BP 102/60 | HR 85 | Ht 71.0 in | Wt 209.8 lb

## 2020-09-17 DIAGNOSIS — I493 Ventricular premature depolarization: Secondary | ICD-10-CM | POA: Diagnosis not present

## 2020-09-17 DIAGNOSIS — I5022 Chronic systolic (congestive) heart failure: Secondary | ICD-10-CM

## 2020-09-17 MED ORDER — MEXILETINE HCL 250 MG PO CAPS: 250.0000 mg | ORAL_CAPSULE | Freq: Two times a day (BID) | ORAL | 3 refills | Status: DC

## 2020-09-17 NOTE — Progress Notes (Signed)
Electrophysiology Office Note   Date:  09/17/2020   ID:  Raymond Vance, DOB 1941-07-24, MRN [id number]  PCP:  Raymond Cho., MD  Cardiologist: Raymond Vance Primary Electrophysiologist:  Raymond Stooksbury Jorja Loa, MD    Chief Complaint: ICD   History of Present Illness: Raymond Vance is a 79 y.o. male who is being seen today for the evaluation of ICD at the request of Raymond Vance. Presenting today for electrophysiology evaluation.  He has a history significant for coronary artery disease status post STEMI, hypertension, hyperlipidemia, tobacco abuse, COPD, and ischemic cardiomyopathy.  He is status post Medtronic CRT-D.  Today, he denies symptoms of palpitations, chest pain, shortness of breath, orthopnea, PND, lower extremity edema, claudication, dizziness, presyncope, syncope, bleeding, or neurologic sequela. The patient is tolerating medications without difficulties.  Since his defibrillator was implanted he has felt much better.  He has less shortness of breath and fatigue.  He is able to do all of his daily activities.  He is ready to get back pain.   Past Medical History:  Diagnosis Date  . COPD (chronic obstructive pulmonary disease) (HCC)   . Hypertension   . STEMI (ST elevation myocardial infarction) [facility])    Past Surgical History:  Procedure Laterality Date  . CATARACT EXTRACTION    . CORONARY ARTERY BYPASS GRAFT       Current Outpatient Medications  Medication Sig Dispense Refill  . albuterol (VENTOLIN HFA) 108 (90 Base) MCG/ACT inhaler Inhale 2 puffs into the lungs every 6 (six) hours as needed for wheezing or shortness of breath.    Marland Kitchen apixaban (ELIQUIS) 5 MG TABS tablet Take 1 tablet (5 mg total) by mouth 2 (two) times daily. 180 tablet 3  . aspirin 81 MG EC tablet Take 81 mg by mouth daily.    Marland Kitchen atorvastatin (LIPITOR) 40 MG tablet Take 40 mg by mouth daily.    . budesonide-formoterol (SYMBICORT) 160-4.5 MCG/ACT inhaler Inhale 2 puffs into the lungs 2 (two) times  daily.    Marland Kitchen ENTRESTO 24-26 MG TAKE 1 TABLET BY MOUTH TWICE A DAY 60 tablet 3  . furosemide (LASIX) 40 MG tablet Take 1 tablet (40 mg total) by mouth daily. 90 tablet 3  . metoprolol tartrate (LOPRESSOR) 50 MG tablet Take 0.5 tablets (25 mg total) by mouth daily. 30 tablet 2  . nitroGLYCERIN (NITROSTAT) 0.4 MG SL tablet Place 1 tablet (0.4 mg total) under the tongue every 5 (five) minutes as needed. 25 tablet 6  . omeprazole (PRILOSEC) 40 MG capsule Take 40 mg by mouth daily.    Marland Kitchen mexiletine (MEXITIL) 250 MG capsule Take 1 capsule (250 mg total) by mouth 2 (two) times daily. 60 capsule 3   No current facility-administered medications for this visit.    Allergies:   Pneumococcal vaccine   Social History:  The patient  reports that he has been smoking. He has never used smokeless tobacco. He reports that he does not drink alcohol and does not use drugs.   Family History:  The patient's Family history is unknown by patient.    ROS:  Please see the history of present illness.   Otherwise, review of systems is positive for none.   All other systems are reviewed and negative.    PHYSICAL EXAM: VS:  BP 102/60   Pulse 85   Ht 5\' 11"  (1.803 m)   Wt 209 lb 12.8 oz (95.2 kg)   SpO2 97%   BMI 29.26 kg/m  , BMI Body mass index is 29.26  kg/m. GEN: Well nourished, well developed, in no acute distress  HEENT: normal  Neck: no JVD, carotid bruits, or masses Cardiac: RRR; no murmurs, rubs, or gallops,no edema  Respiratory:  clear to auscultation bilaterally, normal work of breathing GI: soft, nontender, nondistended, + BS MS: no deformity or atrophy  Skin: warm and dry, device pocket is well healed Neuro:  Strength and sensation are intact Psych: euthymic mood, full affect  EKG:  EKG is ordered today. Personal review of the ekg ordered shows AV paced, PVCs  Device interrogation is reviewed today in detail.  See PaceArt for details.   Recent Labs: 02/21/2020: ALT 19; Hemoglobin 17.4;  Platelets 192; TSH 1.760 09/12/2020: BUN 16; Creatinine, Ser 1.01; Potassium 3.9; Sodium 142    Lipid Panel     Component Value Date/Time   CHOL 151 02/21/2020 0951   TRIG 92 02/21/2020 0951   HDL 60 02/21/2020 0951   CHOLHDL 2.5 02/21/2020 0951   CHOLHDL 3.6 12/19/2019 0332   VLDL 17 12/19/2019 0332   LDLCALC 74 02/21/2020 0951     Wt Readings from Last 3 Encounters:  09/17/20 209 lb 12.8 oz (95.2 kg)  09/12/20 210 lb 0.6 oz (95.3 kg)  07/16/20 (!) 213 lb (96.6 kg)      Other studies Reviewed: Additional studies/ records that were reviewed today include: TTE 12/19/19  Review of the above records today demonstrates:  1. Left ventricular ejection fraction, by visual estimation, is 20 to  25%. The left ventricle has severely decreased function. There is  moderately increased left ventricular hypertrophy.  2. Definity contrast agent was given IV to delineate the left ventricular  endocardial borders. No mural thrombus was noted.  3. Left ventricular diastolic function could not be evaluated.  4. The left ventricle demonstrates global hypokinesis.  5. Global right ventricle has normal systolic function.The right  ventricular size is normal. No increase in right ventricular wall  thickness.  6. Left atrial size was moderately dilated.  7. Right atrial size was mildly dilated.  8. The mitral valve is grossly normal. Trivial mitral valve  regurgitation.  9. The tricuspid valve is grossly normal.  10. The aortic valve is tricuspid. Aortic valve regurgitation is trivial.  Mild aortic valve sclerosis without stenosis.  11. The pulmonic valve was not well visualized. Pulmonic valve  regurgitation is not visualized.  12. Aortic dilatation noted.  13. 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    ASSESSMENT AND PLAN:  1.  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  Is status post  Medtronic CRT-D.  Device is functioning appropriately.  We Raymond Vance arrange for follow-up in device clinic.  He does have a scab over his incision.  We Raymond Vance have him follow-up in device clinic 2 weeks to reassess.  2.  Coronary artery disease: No current chest pain.  Plan per primary cardiology.  3.  Hyperlipidemia: Continue atorvastatin per primary cardiology.  4.  PVCs: Unfortunately his biventricular pacing burden has gone down.  He is pacing at 77%.  This could certainly be due to PVCs.  PVCs appear to be coming currently basal LV.  We Marcoantonio Legault start mexiletine.  Case discussed with primary cardiology  Current medicines are reviewed at length with the patient today.   The patient does not have concerns regarding his medicines.  The following changes were made today: Start mexiletine  Labs/ tests ordered today include:  Orders Placed This Encounter  Procedures  . EKG 12-Lead     Disposition:   FU with Emory Gallentine 3 months  Signed, Zayda Angell Jorja Loa, MD  09/17/2020 2:19 PM     [facility] [address] [place] [address] [phone] (office) [phone] (fax)

## 2020-09-17 NOTE — Patient Instructions (Addendum)
Medication Instructions:  Your physician has recommended you make the following change in your medication:  1. START Mexiletine 250 mg twice a day  *If you need a refill on your cardiac medications before your next appointment, please call your pharmacy*   Lab Work: None ordered   Testing/Procedures: None ordered   Follow-Up: At [facility], you and your health needs are our priority.  As part of our continuing mission to provide you with exceptional heart care, we have created designated Provider Care Teams.  These Care Teams include your primary Cardiologist (physician) and Advanced Practice Providers (APPs -  Physician Assistants and Nurse Practitioners) who all work together to provide you with the care you need, when you need it.  We recommend signing up for the patient portal called "MyChart".  Sign up information is provided on this After Visit Summary.  MyChart is used to connect with patients for Virtual Visits (Telemedicine).  Patients are able to view lab/test results, encounter notes, upcoming appointments, etc.  Non-urgent messages can be sent to your provider as well.   To learn more about what you can do with MyChart, go to [email or web address].    Remote monitoring is used to monitor your Pacemaker or ICD from home. This monitoring reduces the number of office visits required to check your device to one time per year. It allows [place] to keep an eye on the functioning of your device to ensure it is working properly. You are scheduled for a device check from home on 12/10/20. You may send your transmission at any time that day. If you have a wireless device, the transmission will be sent automatically. After your physician reviews your transmission, you will receive a postcard with your next transmission date.  Your next appointment:   3 month(s)  The format for your next appointment:   In Person  Provider:   Loman Brooklyn, MD   Thank you for choosing [facility]!!   Dory Horn, RN [phone]    Other Instructions  Device clinic  [phone]   Mexiletine capsules What is this medicine? MEXILETINE (mex IL e teen) is an antiarrhythmic agent. This medicine is used to treat irregular heart rhythm and can slow rapid heartbeats. It can help your heart to return to and maintain a normal rhythm. Because of the side effects caused by this medicine, it is usually used for heartbeat problems that may be life-threatening. This medicine may be used for other purposes; ask your health care provider or pharmacist if you have questions. COMMON BRAND NAME(S): Mexitil What should I tell my health care provider before I take this medicine? They need to know if you have any of these conditions:  liver disease  other heart problems  previous heart attack  an unusual or allergic reaction to mexiletine, other medicines, foods, dyes, or preservatives  pregnant or trying to get pregnant  breast-feeding How should I use this medicine? Take this medicine by mouth with a glass of water. Follow the directions on the prescription label. It is recommended that you take this medicine with food or an antacid. Take your doses at regular intervals. Do not take your medicine more often than directed. Do not stop taking except on the advice of your doctor or health care professional. Talk to your pediatrician regarding the use of this medicine in children. Special care may be needed. Overdosage: If you think you have taken too much of this medicine contact a poison control center or emergency room at  once. NOTE: This medicine is only for you. Do not share this medicine with others. What if I miss a dose? If you miss a dose, take it as soon as you can. If it is almost time for your next dose, take only that dose. Do not take double or extra doses. What may interact with this medicine? Do not take this medicine with any of the following  medications:  dofetilide This medicine may also interact with the following medications:  caffeine  cimetidine  medicines for depression, anxiety, or psychotic disturbances  medicines to control heart rhythm  phenobarbital  phenytoin  rifampin  theophylline This list may not describe all possible interactions. Give your health care provider a list of all the medicines, herbs, non-prescription drugs, or dietary supplements you use. Also tell them if you smoke, drink alcohol, or use illegal drugs. Some items may interact with your medicine. What should I watch for while using this medicine? Your condition will be monitored closely when you first begin therapy. Often, this drug is first started in a hospital or other monitored health care setting. Once you are on maintenance therapy, visit your doctor or health care provider for regular checks on your progress. Because your condition and use of this medicine carry some risk, it is a good idea to carry an identification card, necklace or bracelet with details of your condition, medications, and doctor or health care provider. You may get drowsy or dizzy. Do not drive, use machinery, or do anything that needs mental alertness until you know how this medicine affects you. Do not stand or sit up quickly, especially if you are an older patient. This reduces the risk of dizzy or fainting spells. Alcohol can make you more dizzy, increase flushing and rapid heartbeats. Avoid alcoholic drinks. This medicine may cause serious skin reactions. They can happen weeks to months after starting the medicine. Contact your health care provider right away if you notice fevers or flu-like symptoms with a Sievers. The Monty may be red or purple and then turn into blisters or peeling of the skin. Or, you might notice a red Porro with swelling of the face, lips or lymph nodes in your neck or under your arms. What side effects may I notice from receiving this  medicine? Side effects that you should report to your doctor or health care professional as soon as possible:  allergic reactions like skin Schaff, itching or hives, swelling of the face, lips, or tongue  breathing problems  chest pain, continued irregular heartbeats  Rodriquez, fever, and swollen lymph nodes  redness, blistering, peeling or loosening of the skin, including inside the mouth  seizures  skin Kempen  trembling, shaking  unusual bleeding or bruising  unusually weak or tired Side effects that usually do not require medical attention (report to your doctor or health care professional if they continue or are bothersome):  blurred vision  difficulty walking  heartburn  nausea, vomiting  nervousness  numbness, or tingling in the fingers or toes This list may not describe all possible side effects. Call your doctor for medical advice about side effects. You may report side effects to FDA at 1-800-FDA-1088. Where should I keep my medicine? Keep out of reach of children. Store at room temperature between 15 and 30 degrees C (59 and 86 degrees F). Throw away any unused medicine after the expiration date. NOTE: This sheet is a summary. It may not cover all possible information. If you have questions about this medicine,  talk to your doctor, pharmacist, or health care provider.  2020 Elsevier/Gold Standard (2019-03-16 09:25:42)

## 2020-09-21 ENCOUNTER — Other Ambulatory Visit: Payer: Self-pay | Admitting: Cardiology

## 2020-09-21 NOTE — Telephone Encounter (Signed)
Will forward to Dr. Elberta Fortis for alternate medication recommendation. Wife aware I will follow up next week w/ recommendation. She is agreeable to plan.

## 2020-09-21 NOTE — Telephone Encounter (Signed)
New message:      Patient wife calling stating that the insurance did not approve a refill of mexiletine the cost 500.00. patient wife would like to knows there something else that he can get.

## 2020-09-24 NOTE — Telephone Encounter (Signed)
Spoke to pt and wife. informed Dr. Elberta Fortis recommends Amiodarone (the only other option). Pt agreeable. Aware I will contact them this week w/ further instruction.  Will address dosing w/ Camnitz.

## 2020-09-25 ENCOUNTER — Telehealth: Payer: Self-pay | Admitting: Cardiology

## 2020-09-25 NOTE — Telephone Encounter (Signed)
     Pt c/o medication issue:  1. Name of Medication: mexiletine (MEXITIL) 250 MG capsule  2. How are you currently taking this medication (dosage and times per day)? Take 1 capsule (250 mg total) by mouth 2 (two) times daily.  3. Are you having a reaction (difficulty breathing--STAT)?   4. What is your medication issue? Pt's wife calling, she said this medication was too expensive she called pt's insurance and the said they will send paperwork to Dr. Elberta Fortis to fill up for assistance for the medication.

## 2020-09-28 ENCOUNTER — Telehealth: Payer: Self-pay

## 2020-09-28 NOTE — Telephone Encounter (Addendum)
**Note De-Identified [name]** I started a Mexiletine PA through covermymeds: Key: [id number]  Following message was received: Raymond Vance Key: [id number] - PA Case ID: [id number] Need help? Call us at [phone]  Outcome   There is an existing case within the [facility] environment that has the same patient, prescriber, and drug. This case must be finalized before proceeding with similar requests. I will call number provided to do this PA.

## 2020-09-28 NOTE — Telephone Encounter (Addendum)
**Note De-Identified Theophilus Walz Obfuscation** I called [employer] and s/w Jen who transferred me to [facility] to answer questions concerning the pts Mexiltine PA. Call dropped so I called [phone] and s/w Dewan.  Per Dewan the pt must first try and fail alternative medications that are on the pts plan of covered medications but cannot tell me what the alternatives are. I requested to do this non-formulary Mexiletine PA anyway and he did allow me to over the phone. I answered all the questions concerning this PA and per Dewan they will fax [place] and mail the pt a determination letter within 72 hours.  EOC: [id number]

## 2020-10-02 NOTE — Telephone Encounter (Signed)
**Note De-Identified Cinzia Devos Obfuscation** Letter received from [facility] stating that they have approved the pts Mexiletine PA. Approval is valid until 12/21/2020  I have notified the pts pharmacy of this approval.

## 2020-10-15 ENCOUNTER — Telehealth: Payer: Self-pay | Admitting: Cardiology

## 2020-10-15 NOTE — Telephone Encounter (Signed)
Wife reports that pt has been taking the new medication (Mexiletine) for about a week now, maybe a little more. Yesterday they were at [facility] and he started not feeling well.  Started vomiting last night into this morning. Reports that he hasn't been out of the house or in contact with others besides yesterday. Advised to have pt seen by PCP to evaluate/rule out illness/GI bug.  Aware that Dr. Elberta Fortis agrees w/ this plan. Advised to call and let us know outcome and/or if we need to trial pt off Mexiletine (although informed that we don't believe it is medication related issue). Wife agreeable to plan.

## 2020-10-15 NOTE — Telephone Encounter (Signed)
    Pt's wife calling, she said the pt been throwing up all night and until this morning. She wanted to speak with Dr. Elberta Fortis nurse to know what the pt can do.

## 2020-10-16 NOTE — Telephone Encounter (Signed)
Followed up with wife. Reports pt doing somewhat better, reports no further vomiting. He does still have an "upset stomach".  Advised to have pt take future doses w/ food to see if this helps reduce any GI issues.  Aware if this does not help, ok to hold Mexiletine for a day/two to see if issues resolves. Advised to call me by end of this week/next to update me on how pt is doing. Dr. Elberta Fortis aware of conversation/plan and is agreeable. Wife agreeable to plan and appreciates the follow up with this.

## 2020-10-29 NOTE — Telephone Encounter (Signed)
Followed up with wife after not hearing back. She reports that pt just got home after hospitalization for seizures.   She reports that hospital reduced Mexiletine dose down to 150 mg BID. Camnitz made aware and agreeable. Advised wife to have pt continue Mexiletine dosing of 150 mg BID.  Aware we will continue to monitor things for now. Wife agreeable to plan.

## 2020-11-02 ENCOUNTER — Other Ambulatory Visit: Payer: Self-pay | Admitting: Cardiology

## 2020-11-04 DIAGNOSIS — R569 Unspecified convulsions: Secondary | ICD-10-CM

## 2020-11-04 DIAGNOSIS — E538 Deficiency of other specified B group vitamins: Secondary | ICD-10-CM

## 2020-11-04 DIAGNOSIS — N200 Calculus of kidney: Secondary | ICD-10-CM | POA: Insufficient documentation

## 2020-11-04 HISTORY — DX: Unspecified convulsions: R56.9

## 2020-11-04 HISTORY — DX: Deficiency of other specified B group vitamins: E53.8

## 2020-11-04 HISTORY — DX: Calculus of kidney: N20.0

## 2020-11-23 ENCOUNTER — Telehealth: Payer: Self-pay | Admitting: Cardiology

## 2020-11-23 NOTE — Telephone Encounter (Signed)
Left detailed message informing pt that I would discuss this further with Dr. Elberta Fortis and let him know Monday, as MD is not in the office today.

## 2020-11-23 NOTE — Telephone Encounter (Signed)
Patient's wife is returning call. Please return call to [phone]. She states they have been having issues with the main line.

## 2020-11-23 NOTE — Telephone Encounter (Signed)
*  STAT* If patient is at the pharmacy, call can be transferred to refill team.   1. Which medications need to be refilled? (please list name of each medication and dose if known)  mexiletine (MEXITIL) 250 MG capsule  2. Which pharmacy/location (including street and city if local pharmacy) is medication to be sent to? [id number] - [place], [place] - [address], [address] AT [facility] [address]  3. Do they need a 30 day or 90 day supply?  90 day if possible   Pt c/o medication issue:  1. Name of Medication: mexiletine (MEXITIL) 250 MG capsule  2. How are you currently taking this medication (dosage and times per day)?  Taking 150 MG 2x per day as directed by the hospital   3. Are you having a reaction (difficulty breathing--STAT)?  No   4. What is your medication issue?  States he was told at the hospital to only take 150 MG two times per day. They need new prescription for this sent in. If unable or any questions please contact patient.

## 2020-11-23 NOTE — Telephone Encounter (Signed)
Spoke to wife.  Aware I will discuss w/ Camnitz Monday. She will call [facility] office to see if they will give Rx for week until such time as Dr. Elberta Fortis can review chart and approve/disapprove of medication change. (number to their office given to wife) Wife agreeable to plan.

## 2020-11-26 ENCOUNTER — Other Ambulatory Visit: Payer: Self-pay

## 2020-11-26 DIAGNOSIS — I213 ST elevation (STEMI) myocardial infarction of unspecified site: Secondary | ICD-10-CM | POA: Insufficient documentation

## 2020-11-26 DIAGNOSIS — J449 Chronic obstructive pulmonary disease, unspecified: Secondary | ICD-10-CM | POA: Insufficient documentation

## 2020-11-26 DIAGNOSIS — I10 Essential (primary) hypertension: Secondary | ICD-10-CM | POA: Insufficient documentation

## 2020-11-26 MED ORDER — MEXILETINE HCL 150 MG PO CAPS
150.0000 mg | ORAL_CAPSULE | Freq: Two times a day (BID) | ORAL | 6 refills | Status: DC
Start: 2020-11-26 — End: 2021-05-22

## 2020-11-26 NOTE — Addendum Note (Signed)
Addended by: Baird Lyons on: 11/26/2020 02:20 PM   Modules accepted: Orders

## 2020-11-26 NOTE — Telephone Encounter (Signed)
Wife made aware (dpr on file), Rx sent to pharmacy for 150 mg capsules. She appreciates our help and returning her call

## 2020-11-26 NOTE — Telephone Encounter (Signed)
Patient's wife calling back to follow up. She states to call: [phone]

## 2020-11-27 ENCOUNTER — Encounter: Payer: Self-pay | Admitting: Cardiology

## 2020-11-27 ENCOUNTER — Ambulatory Visit: Payer: Medicare HMO | Admitting: Cardiology

## 2020-11-27 ENCOUNTER — Other Ambulatory Visit: Payer: Self-pay

## 2020-11-27 VITALS — BP 102/68 | HR 76 | Ht 71.0 in | Wt 209.0 lb

## 2020-11-27 DIAGNOSIS — E782 Mixed hyperlipidemia: Secondary | ICD-10-CM | POA: Diagnosis not present

## 2020-11-27 DIAGNOSIS — I10 Essential (primary) hypertension: Secondary | ICD-10-CM

## 2020-11-27 DIAGNOSIS — F17210 Nicotine dependence, cigarettes, uncomplicated: Secondary | ICD-10-CM

## 2020-11-27 DIAGNOSIS — Z95810 Presence of automatic (implantable) cardiac defibrillator: Secondary | ICD-10-CM

## 2020-11-27 DIAGNOSIS — Z951 Presence of aortocoronary bypass graft: Secondary | ICD-10-CM

## 2020-11-27 DIAGNOSIS — I255 Ischemic cardiomyopathy: Secondary | ICD-10-CM | POA: Diagnosis not present

## 2020-11-27 NOTE — Patient Instructions (Signed)
Medication Instructions:  No medication changes. *If you need a refill on your cardiac medications before your next appointment, please call your pharmacy*   Lab Work: Your physician recommends that you have labs done in the office today. Your test included  basic metabolic panel, complete blood count, TSH, liver function and lipids.  If you have labs (blood work) drawn today and your tests are completely normal, you will receive your results only by: Marland Kitchen MyChart Message (if you have MyChart) OR . A paper copy in the mail If you have any lab test that is abnormal or we need to change your treatment, we will call you to review the results.   Testing/Procedures: Your physician has requested that you have an echocardiogram. Echocardiography is a painless test that uses sound waves to create images of your heart. It provides your doctor with information about the size and shape of your heart and how well your heart's chambers and valves are working. This procedure takes approximately one hour. There are no restrictions for this procedure.     Follow-Up: At [facility], you and your health needs are our priority.  As part of our continuing mission to provide you with exceptional heart care, we have created designated Provider Care Teams.  These Care Teams include your primary Cardiologist (physician) and Advanced Practice Providers (APPs -  Physician Assistants and Nurse Practitioners) who all work together to provide you with the care you need, when you need it.  We recommend signing up for the patient portal called "MyChart".  Sign up information is provided on this After Visit Summary.  MyChart is used to connect with patients for Virtual Visits (Telemedicine).  Patients are able to view lab/test results, encounter notes, upcoming appointments, etc.  Non-urgent messages can be sent to your provider as well.   To learn more about what you can do with MyChart, go to [email or web address].     Your next appointment:   3 month(s)  The format for your next appointment:   In Person  Provider:   Belva Crome, MD   Other Instructions  Echocardiogram An echocardiogram is a procedure that uses painless sound waves (ultrasound) to produce an image of the heart. Images from an echocardiogram can provide important information about:  Signs of coronary artery disease (CAD).  Aneurysm detection. An aneurysm is a weak or damaged part of an artery wall that bulges out from the normal force of blood pumping through the body.  Heart size and shape. Changes in the size or shape of the heart can be associated with certain conditions, including heart failure, aneurysm, and CAD.  Heart muscle function.  Heart valve function.  Signs of a past heart attack.  Fluid buildup around the heart.  Thickening of the heart muscle.  A tumor or infectious growth around the heart valves. Tell a health care provider about:  Any allergies you have.  All medicines you are taking, including vitamins, herbs, eye drops, creams, and over-the-counter medicines.  Any blood disorders you have.  Any surgeries you have had.  Any medical conditions you have.  Whether you are pregnant or may be pregnant. What are the risks? Generally, this is a safe procedure. However, problems may occur, including:  Allergic reaction to dye (contrast) that may be used during the procedure. What happens before the procedure? No specific preparation is needed. You may eat and drink normally. What happens during the procedure?   An IV tube may be inserted into one of your  veins.  You may receive contrast through this tube. A contrast is an injection that improves the quality of the pictures from your heart.  A gel will be applied to your chest.  A wand-like tool (transducer) will be moved over your chest. The gel will help to transmit the sound waves from the transducer.  The sound waves will harmlessly  bounce off of your heart to allow the heart images to be captured in real-time motion. The images will be recorded on a computer. The procedure may vary among health care providers and hospitals. What happens after the procedure?  You may return to your normal, everyday life, including diet, activities, and medicines, unless your health care provider tells you not to do that. Summary  An echocardiogram is a procedure that uses painless sound waves (ultrasound) to produce an image of the heart.  Images from an echocardiogram can provide important information about the size and shape of your heart, heart muscle function, heart valve function, and fluid buildup around your heart.  You do not need to do anything to prepare before this procedure. You may eat and drink normally.  After the echocardiogram is completed, you may return to your normal, everyday life, unless your health care provider tells you not to do that. This information is not intended to replace advice given to you by your health care provider. Make sure you discuss any questions you have with your health care provider. Document Revised: 03/31/2019 Document Reviewed: 01/10/2017 Elsevier Patient Education  2020 Elsevier Inc.   

## 2020-11-27 NOTE — Progress Notes (Signed)
Cardiology Office Note:    Date:  11/27/2020   ID:  Raymond Vance, DOB 11/28/1941, MRN [id number]  PCP:  Elspeth Cho., MD  Cardiologist:  Garwin Brothers, MD   Referring MD: Elspeth Cho., MD    ASSESSMENT:    1. Ischemic cardiomyopathy   2. Essential hypertension   3. Cigarette smoker   4. Mixed dyslipidemia   5. Hx of CABG   6. Biventricular automatic implantable cardioverter defibrillator in situ    PLAN:    In order of problems listed above:  1. Ischemic cardiomyopathy: Secondary prevention stressed with the patient.  Importance of compliance with diet medication stressed any vocalized understanding.  He is on guideline directed medical therapy at this time.  His ejection fraction was markedly depressed and we will do an echocardiogram to assess this further.  He is on Entresto now. 2. Post defibrillator insertion: Managed and monitored by our electrophysiology colleague.  Questions were answered.  I reviewed the notes. 3. Cigarette smoker: I spent 5 minutes with the patient discussing solely about smoking. Smoking cessation was counseled. I suggested to the patient also different medications and pharmacological interventions. Patient is keen to try stopping on its own at this time. He will get back to me if he needs any further assistance in this matter. 4. Mixed dyslipidemia: Diet was emphasized.  He is fasting today and will have blood work today. 5. Patient will be seen in follow-up appointment in 6 months or earlier if the patient has any concerns    Medication Adjustments/Labs and Tests Ordered: Current medicines are reviewed at length with the patient today.  Concerns regarding medicines are outlined above.  No orders of the defined types were placed in this encounter.  No orders of the defined types were placed in this encounter.    No chief complaint on file.    History of Present Illness:    Raymond Vance is a 79 y.o. male.  Patient has past medical  history of coronary artery disease post CABG and ischemic cardiomyopathy.  He has COPD and unfortunately continues to smoke.  No chest pain orthopnea or PND.  He is under the care of her electrophysiology colleagues he is also on Entresto and has a defibrillator.  At the time of my evaluation, the patient is alert awake oriented and in no distress.  Past Medical History:  Diagnosis Date  . Biventricular automatic implantable cardioverter defibrillator in situ 08/15/2020   Last Assessment & Plan:  Formatting of this note might be different from the original. Images from the original note were not included. Normal device function. In an attempt to suppress some of the PVCs of increased base rate to 70 bpm.  Maintain a sleep rate at 60.   We will continue to monitor his rhythms and consider membrane stabilizing agent in the near future.  . Cardiomyopathy (HCC) 01/13/2020  . Cigarette smoker 07/16/2020  . COPD (chronic obstructive pulmonary disease) (HCC)   . Essential hypertension 03/30/2020  . Hx of CABG 07/16/2020  . Hypertension   . Low serum vitamin B12 11/04/2020  . Mixed dyslipidemia 07/16/2020  . Nephrolithiasis 11/04/2020  . 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 Aug 23, 2002 Entered By: [email or web address],[id number] J Comment: s/p lumbar laminectomy  . Seizure (HCC) 11/04/2020  . STEMI (ST elevation myocardial infarction) (HCC)   . Syncope and collapse 08/12/2020  Last Assessment & Plan:  Formatting of this note might be different from the original. Increased dizziness since the procedure.  He denies orthostasis but explains that tilting his head for example while sitting in a chair looking at his feet and looking at the ceiling will make him dizzy.  Explains that the room is spinning.  He denies any symptoms such as tunnel vision or tilt the unsteadiness w    Past Surgical History:    Procedure Laterality Date  . CATARACT EXTRACTION    . CORONARY ARTERY BYPASS GRAFT      Current Medications: Current Meds  Medication Sig  . albuterol (VENTOLIN HFA) 108 (90 Base) MCG/ACT inhaler Inhale 2 puffs into the lungs every 6 (six) hours as needed for wheezing or shortness of breath.  Marland Kitchen apixaban (ELIQUIS) 5 MG TABS tablet Take 1 tablet (5 mg total) by mouth 2 (two) times daily.  Marland Kitchen aspirin 81 MG EC tablet Take 81 mg by mouth daily.  Marland Kitchen atorvastatin (LIPITOR) 40 MG tablet Take 40 mg by mouth daily.  . budesonide-formoterol (SYMBICORT) 160-4.5 MCG/ACT inhaler Inhale 2 puffs into the lungs 2 (two) times daily.  Marland Kitchen ENTRESTO 24-26 MG TAKE 1 TABLET BY MOUTH TWICE A DAY  . furosemide (LASIX) 40 MG tablet Take 1 tablet (40 mg total) by mouth daily.  Marland Kitchen levETIRAcetam (KEPPRA) 1000 MG tablet Take 1,000 mg by mouth.  . metoprolol tartrate (LOPRESSOR) 50 MG tablet Take 50 mg by mouth 2 (two) times daily.  Marland Kitchen mexiletine (MEXITIL) 150 MG capsule Take 1 capsule (150 mg total) by mouth 2 (two) times daily.  Marland Kitchen omeprazole (PRILOSEC) 40 MG capsule Take 40 mg by mouth daily.  . vitamin B-12 (CYANOCOBALAMIN) 500 MCG tablet Take 500 mcg by mouth daily.     Allergies:   Pneumococcal vaccine   Social History   Socioeconomic History  . Marital status: Married    Spouse name: Not on file  . Number of children: Not on file  . Years of education: Not on file  . Highest education level: Not on file  Occupational History  . Not on file  Tobacco Use  . Smoking status: Current Every Day Smoker  . Smokeless tobacco: Never Used  Substance and Sexual Activity  . Alcohol use: Never  . Drug use: Never  . Sexual activity: Not on file  Other Topics Concern  . Not on file  Social History Narrative  . Not on file   Social Determinants of Health   Financial Resource Strain:   . Difficulty of Paying Living Expenses: Not on file  Food Insecurity:   . Worried About Programme researcher, broadcasting/film/video in the Last Year:  Not on file  . Ran Out of Food in the Last Year: Not on file  Transportation Needs:   . Lack of Transportation (Medical): Not on file  . Lack of Transportation (Non-Medical): Not on file  Physical Activity:   . Days of Exercise per Week: Not on file  . Minutes of Exercise per Session: Not on file  Stress:   . Feeling of Stress : Not on file  Social Connections:   . Frequency of Communication with Friends and Family: Not on file  . Frequency of Social Gatherings with Friends and Family: Not on file  . Attends Religious Services: Not on file  . Active Member of Clubs or Organizations: Not on file  . Attends Banker Meetings: Not on file  . Marital Status: Not on file  Family History: The patient's Family history is unknown by patient.  ROS:   Please see the history of present illness.    All other systems reviewed and are negative.  EKGs/Labs/Other Studies Reviewed:    The following studies were reviewed today: IMPRESSIONS    1. Left ventricular ejection fraction, by visual estimation, is 20 to  25%. The left ventricle has severely decreased function. There is  moderately increased left ventricular hypertrophy.  2. Definity contrast agent was given IV to delineate the left ventricular  endocardial borders. No mural thrombus was noted.  3. Left ventricular diastolic function could not be evaluated.  4. The left ventricle demonstrates global hypokinesis.  5. Global right ventricle has normal systolic function.The right  ventricular size is normal. No increase in right ventricular wall  thickness.  6. Left atrial size was moderately dilated.  7. Right atrial size was mildly dilated.  8. The mitral valve is grossly normal. Trivial mitral valve  regurgitation.  9. The tricuspid valve is grossly normal.  10. The aortic valve is tricuspid. Aortic valve regurgitation is trivial.  Mild aortic valve sclerosis without stenosis.  11. The pulmonic valve was  not well visualized. Pulmonic valve  regurgitation is not visualized.  12. Aortic dilatation noted.  13. 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    Recent Labs: 02/21/2020: ALT 19; Hemoglobin 17.4; Platelets 192; TSH 1.760 09/12/2020: BUN 16; Creatinine, Ser 1.01; Potassium 3.9; Sodium 142  Recent Lipid Panel    Component Value Date/Time   CHOL 151 02/21/2020 0951   TRIG 92 02/21/2020 0951   HDL 60 02/21/2020 0951   CHOLHDL 2.5 02/21/2020 0951   CHOLHDL 3.6 12/19/2019 0332   VLDL 17 12/19/2019 0332   LDLCALC 74 02/21/2020 0951    Physical Exam:    VS:  BP 102/68   Pulse 76   Ht 5\' 11"  (1.803 m)   Wt 209 lb (94.8 kg)   SpO2 97%   BMI 29.15 kg/m     Wt Readings from Last 3 Encounters:  11/27/20 209 lb (94.8 kg)  09/17/20 209 lb 12.8 oz (95.2 kg)  09/12/20 210 lb 0.6 oz (95.3 kg)     GEN: Patient is in no acute distress HEENT: Normal NECK: No JVD; No carotid bruits LYMPHATICS: No lymphadenopathy CARDIAC: Hear sounds regular, 2/6 systolic murmur at the apex. RESPIRATORY:  Clear to auscultation without rales, wheezing or rhonchi  ABDOMEN: Soft, non-tender, non-distended MUSCULOSKELETAL:  No edema; No deformity  SKIN: Warm and dry NEUROLOGIC:  Alert and oriented x 3 PSYCHIATRIC:  Normal affect   Signed, 09/14/20, MD  11/27/2020 10:13 AM    [facility]

## 2020-11-28 LAB — LIPID PANEL
Chol/HDL Ratio: 2.6 ratio (ref 0.0–5.0)
Cholesterol, Total: 146 mg/dL (ref 100–199)
HDL: 56 mg/dL (ref >39)
LDL Chol Calc (NIH): 68 mg/dL (ref 0–99)
Triglycerides: 128 mg/dL (ref 0–149)
VLDL Cholesterol Cal: 22 mg/dL (ref 5–40)

## 2020-11-28 LAB — BASIC METABOLIC PANEL
BUN/Creatinine Ratio: 12 (ref 10–24)
BUN: 14 mg/dL (ref 8–27)
CO2: 25 mmol/L (ref 20–29)
Calcium: 9.2 mg/dL (ref 8.6–10.2)
Chloride: 99 mmol/L (ref 96–106)
Creatinine, Ser: 1.14 mg/dL (ref 0.76–1.27)
GFR calc Af Amer: 70 mL/min/{1.73_m2} (ref >59)
GFR calc non Af Amer: 60 mL/min/{1.73_m2} (ref >59)
Glucose: 104 mg/dL — ABNORMAL HIGH (ref 65–99)
Potassium: 3.4 mmol/L — ABNORMAL LOW (ref 3.5–5.2)
Sodium: 139 mmol/L (ref 134–144)

## 2020-11-28 LAB — CBC WITH DIFFERENTIAL/PLATELET
Basophils Absolute: 0.1 10*3/uL (ref 0.0–0.2)
Basos: 1 %
EOS (ABSOLUTE): 0.4 10*3/uL (ref 0.0–0.4)
Eos: 4 %
Hematocrit: 45.3 % (ref 37.5–51.0)
Hemoglobin: 15.3 g/dL (ref 13.0–17.7)
Immature Grans (Abs): 0 10*3/uL (ref 0.0–0.1)
Immature Granulocytes: 0 %
Lymphocytes Absolute: 2.6 10*3/uL (ref 0.7–3.1)
Lymphs: 33 %
MCH: 30.7 pg (ref 26.6–33.0)
MCHC: 33.8 g/dL (ref 31.5–35.7)
MCV: 91 fL (ref 79–97)
Monocytes Absolute: 0.6 10*3/uL (ref 0.1–0.9)
Monocytes: 8 %
Neutrophils Absolute: 4.3 10*3/uL (ref 1.4–7.0)
Neutrophils: 54 %
Platelets: 158 10*3/uL (ref 150–450)
RBC: 4.99 x10E6/uL (ref 4.14–5.80)
RDW: 12.8 % (ref 11.6–15.4)
WBC: 8 10*3/uL (ref 3.4–10.8)

## 2020-11-28 LAB — HEPATIC FUNCTION PANEL
ALT: 15 IU/L (ref 0–44)
AST: 13 IU/L (ref 0–40)
Albumin: 4.1 g/dL (ref 3.7–4.7)
Alkaline Phosphatase: 103 IU/L (ref 44–121)
Bilirubin Total: 0.5 mg/dL (ref 0.0–1.2)
Bilirubin, Direct: 0.21 mg/dL (ref 0.00–0.40)
Total Protein: 6.5 g/dL (ref 6.0–8.5)

## 2020-11-28 LAB — TSH: TSH: 2.93 u[IU]/mL (ref 0.450–4.500)

## 2020-12-17 ENCOUNTER — Encounter: Payer: Self-pay | Admitting: Cardiology

## 2020-12-17 ENCOUNTER — Ambulatory Visit (INDEPENDENT_AMBULATORY_CARE_PROVIDER_SITE_OTHER): Payer: Medicare HMO

## 2020-12-17 ENCOUNTER — Other Ambulatory Visit: Payer: Self-pay

## 2020-12-17 ENCOUNTER — Ambulatory Visit (INDEPENDENT_AMBULATORY_CARE_PROVIDER_SITE_OTHER): Payer: Medicare HMO | Admitting: Cardiology

## 2020-12-17 VITALS — BP 110/76 | HR 83 | Ht 71.0 in | Wt 205.0 lb

## 2020-12-17 DIAGNOSIS — I255 Ischemic cardiomyopathy: Secondary | ICD-10-CM

## 2020-12-17 DIAGNOSIS — I5022 Chronic systolic (congestive) heart failure: Secondary | ICD-10-CM

## 2020-12-17 LAB — CUP PACEART REMOTE DEVICE CHECK
Battery Remaining Longevity: 98 mo
Battery Voltage: 3.04 V
Brady Statistic AP VP Percent: 94.3 %
Brady Statistic AP VS Percent: 0.84 %
Brady Statistic AS VP Percent: 4.79 %
Brady Statistic AS VS Percent: 0.07 %
Brady Statistic RA Percent Paced: 94.25 %
Brady Statistic RV Percent Paced: 94.55 %
Date Time Interrogation Session: 20211227012503
HighPow Impedance: 69 Ohm
Implantable Lead Implant Date: 20210825
Implantable Lead Implant Date: 20210825
Implantable Lead Implant Date: 20210825
Implantable Lead Location: 753858
Implantable Lead Location: 753859
Implantable Lead Location: 753860
Implantable Lead Model: 4398
Implantable Lead Model: 5076
Implantable Pulse Generator Implant Date: 20210825
Lead Channel Impedance Value: 184.154
Lead Channel Impedance Value: 191.854
Lead Channel Impedance Value: 191.854
Lead Channel Impedance Value: 208.568
Lead Channel Impedance Value: 208.568
Lead Channel Impedance Value: 342 Ohm
Lead Channel Impedance Value: 399 Ohm
Lead Channel Impedance Value: 437 Ohm
Lead Channel Impedance Value: 437 Ohm
Lead Channel Impedance Value: 437 Ohm
Lead Channel Impedance Value: 494 Ohm
Lead Channel Impedance Value: 513 Ohm
Lead Channel Impedance Value: 513 Ohm
Lead Channel Impedance Value: 646 Ohm
Lead Channel Impedance Value: 665 Ohm
Lead Channel Impedance Value: 665 Ohm
Lead Channel Impedance Value: 703 Ohm
Lead Channel Impedance Value: 703 Ohm
Lead Channel Pacing Threshold Amplitude: 0.625 V
Lead Channel Pacing Threshold Amplitude: 0.625 V
Lead Channel Pacing Threshold Amplitude: 1.125 V
Lead Channel Pacing Threshold Pulse Width: 0.4 ms
Lead Channel Pacing Threshold Pulse Width: 0.4 ms
Lead Channel Pacing Threshold Pulse Width: 0.4 ms
Lead Channel Sensing Intrinsic Amplitude: 2.5 mV
Lead Channel Sensing Intrinsic Amplitude: 2.5 mV
Lead Channel Sensing Intrinsic Amplitude: 25.875 mV
Lead Channel Sensing Intrinsic Amplitude: 25.875 mV
Lead Channel Setting Pacing Amplitude: 1.5 V
Lead Channel Setting Pacing Amplitude: 1.75 V
Lead Channel Setting Pacing Amplitude: 2 V
Lead Channel Setting Pacing Pulse Width: 0.4 ms
Lead Channel Setting Pacing Pulse Width: 0.4 ms
Lead Channel Setting Sensing Sensitivity: 0.3 mV

## 2020-12-17 NOTE — Progress Notes (Signed)
Electrophysiology Office Note   Date:  12/17/2020   ID:  Raymond Vance, DOB March 18, 1941, MRN [id number]  PCP:  Elspeth Cho., MD  Cardiologist: Revankar Primary Electrophysiologist:  Myelle Poteat Jorja Loa, MD    Chief Complaint: ICD   History of Present Illness: Raymond Vance is a 79 y.o. male who is being seen today for the evaluation of ICD at the request of RajanRevankar. Presenting today for electrophysiology evaluation.  He has a history significant for coronary artery disease status post STEMI, hypertension, hyperlipidemia, tobacco abuse, COPD, and ischemic cardiomyopathy.  He is status post Medtronic CRT-D.  He was noted to have  decreased biventricular pacing and has since been started on mexiletine.  Today, denies symptoms of palpitations, chest pain, shortness of breath, orthopnea, PND, lower extremity edema, claudication, dizziness, presyncope, syncope, bleeding, or neurologic sequela. The patient is tolerating medications without difficulties.  He feels well today.  He has no chest pain or shortness of breath.  In November, he had 3 seizures which were witnessed by his wife.  He presented to the hospital at [facility].  He was started on Keppra, his mexiletine dose was decreased by half, and he was prescribed vitamin B12.  He has not had any further episodes.   Past Medical History:  Diagnosis Date  . Biventricular automatic implantable cardioverter defibrillator in situ 08/15/2020   Last Assessment & Plan:  Formatting of this note might be different from the original. Images from the original note were not included. Normal device function. In an attempt to suppress some of the PVCs of increased base rate to 70 bpm.  Maintain a sleep rate at 60.   We Tasheema Perrone continue to monitor his rhythms and consider membrane stabilizing agent in the near future.  . Cardiomyopathy (HCC) 01/13/2020  . Cigarette smoker 07/16/2020  . COPD (chronic obstructive pulmonary disease) (HCC)   . Essential  hypertension 03/30/2020  . Hx of CABG 07/16/2020  . Hypertension   . Low serum vitamin B12 11/04/2020  . Mixed dyslipidemia 07/16/2020  . Nephrolithiasis 11/04/2020  . 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 Aug 23, 2002 Entered By: [email or web address] J Comment: s/p lumbar laminectomy  . Seizure (HCC) 11/04/2020  . STEMI (ST elevation myocardial infarction) (HCC)   . Syncope and collapse 08/12/2020   Last Assessment & Plan:  Formatting of this note might be different from the original. Increased dizziness since the procedure.  He denies orthostasis but explains that tilting his head for example while sitting in a chair looking at his feet and looking at the ceiling Chan Sheahan make him dizzy.  Explains that the room is spinning.  He denies any symptoms such as tunnel vision or tilt the unsteadiness w   Past Surgical History:  Procedure Laterality Date  . CATARACT EXTRACTION    . CORONARY ARTERY BYPASS GRAFT       Current Outpatient Medications  Medication Sig Dispense Refill  . albuterol (VENTOLIN HFA) 108 (90 Base) MCG/ACT inhaler Inhale 2 puffs into the lungs every 6 (six) hours as needed for wheezing or shortness of breath.    Marland Kitchen apixaban (ELIQUIS) 5 MG TABS tablet Take 1 tablet (5 mg total) by mouth 2 (two) times daily. 180 tablet 3  . aspirin 81 MG EC tablet Take 81 mg by mouth daily.    Marland Kitchen atorvastatin (LIPITOR) 40 MG tablet Take 40 mg by mouth  daily.    . budesonide-formoterol (SYMBICORT) 160-4.5 MCG/ACT inhaler Inhale 2 puffs into the lungs 2 (two) times daily.    Marland Kitchen ENTRESTO 24-26 MG TAKE 1 TABLET BY MOUTH TWICE A DAY 180 tablet 3  . furosemide (LASIX) 40 MG tablet Take 1 tablet (40 mg total) by mouth daily. 90 tablet 3  . levETIRAcetam (KEPPRA) 1000 MG tablet Take 1,000 mg by mouth.    . metoprolol tartrate (LOPRESSOR) 50 MG tablet Take 50 mg by mouth 2 (two) times daily.    Marland Kitchen  mexiletine (MEXITIL) 150 MG capsule Take 1 capsule (150 mg total) by mouth 2 (two) times daily. 60 capsule 6  . omeprazole (PRILOSEC) 40 MG capsule Take 40 mg by mouth daily.    . vitamin B-12 (CYANOCOBALAMIN) 500 MCG tablet Take 500 mcg by mouth daily.    . nitroGLYCERIN (NITROSTAT) 0.4 MG SL tablet Place 1 tablet (0.4 mg total) under the tongue every 5 (five) minutes as needed. 25 tablet 6   No current facility-administered medications for this visit.    Allergies:   Pneumococcal vaccine   Social History:  The patient  reports that he has been smoking. He has never used smokeless tobacco. He reports that he does not drink alcohol and does not use drugs.   Family History:  The patient's Family history is unknown by patient.    ROS:  Please see the history of present illness.   Otherwise, review of systems is positive for none.   All other systems are reviewed and negative.   PHYSICAL EXAM: VS:  BP 110/76   Pulse 83   Ht 5\' 11"  (1.803 m)   Wt 205 lb (93 kg)   SpO2 97%   BMI 28.59 kg/m  , BMI Body mass index is 28.59 kg/m. GEN: Well nourished, well developed, in no acute distress  HEENT: normal  Neck: no JVD, carotid bruits, or masses Cardiac: RRR; no murmurs, rubs, or gallops,no edema  Respiratory:  clear to auscultation bilaterally, normal work of breathing GI: soft, nontender, nondistended, + BS MS: no deformity or atrophy  Skin: warm and dry, device site well healed Neuro:  Strength and sensation are intact Psych: euthymic mood, full affect  EKG:  EKG is ordered today. Personal review of the ekg ordered shows AV paced, frequent PVCs  Personal review of the device interrogation today. Results in [facility]   Recent Labs: 11/27/2020: ALT 15; BUN 14; Creatinine, Ser 1.14; Hemoglobin 15.3; Platelets 158; Potassium 3.4; Sodium 139; TSH 2.930    Lipid Panel     Component Value Date/Time   CHOL 146 11/27/2020 1027   TRIG 128 11/27/2020 1027   HDL 56 11/27/2020 1027    CHOLHDL 2.6 11/27/2020 1027   CHOLHDL 3.6 12/19/2019 0332   VLDL 17 12/19/2019 0332   LDLCALC 68 11/27/2020 1027     Wt Readings from Last 3 Encounters:  12/17/20 205 lb (93 kg)  11/27/20 209 lb (94.8 kg)  09/17/20 209 lb 12.8 oz (95.2 kg)      Other studies Reviewed: Additional studies/ records that were reviewed today include: TTE 12/19/19  Review of the above records today demonstrates:  1. Left ventricular ejection fraction, by visual estimation, is 20 to  25%. The left ventricle has severely decreased function. There is  moderately increased left ventricular hypertrophy.  2. Definity contrast agent was given IV to delineate the left ventricular  endocardial borders. No mural thrombus was noted.  3. Left ventricular diastolic function could not be  evaluated.  4. The left ventricle demonstrates global hypokinesis.  5. Global right ventricle has normal systolic function.The right  ventricular size is normal. No increase in right ventricular wall  thickness.  6. Left atrial size was moderately dilated.  7. Right atrial size was mildly dilated.  8. The mitral valve is grossly normal. Trivial mitral valve  regurgitation.  9. The tricuspid valve is grossly normal.  10. The aortic valve is tricuspid. Aortic valve regurgitation is trivial.  Mild aortic valve sclerosis without stenosis.  11. The pulmonic valve was not well visualized. Pulmonic valve  regurgitation is not visualized.  12. Aortic dilatation noted.  13. 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    ASSESSMENT AND PLAN:  1.  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  Is status post Medtronic CRT-D.  Device functioning appropriately.  Unfortunately his biventricular pacing remains low at 88%.  He was diagnosed with a seizure at [facility] and his mexiletine dose was reduced as it was thought that this may be part  of the cause.  High-risk medication  2.  Coronary artery disease: No current chest pain.  Plan per primary cardiology.  3.  Hyperlipidemia: Continue atorvastatin per primary cardiology  4.  PVCs: Unfortunately biventricular pacing burden has gone down to 77%.  He has since been started on mexiletine.  This is improved his pacing up to 88%.  We Json Koelzer work to get records from [facility] to see why mexiletine was reduced.   Current medicines are reviewed at length with the patient today.   The patient does not have concerns regarding his medicines.  The following changes were made today: None  Labs/ tests ordered today include:  Orders Placed This Encounter  Procedures  . EKG 12-Lead     Disposition:   FU with Sabrena Gavitt 6 months  Signed, Bladen Umar Jorja Loa, MD  12/17/2020 2:27 PM     [facility] [address] [place] [address] [phone] (office) [phone] (fax)

## 2020-12-26 ENCOUNTER — Inpatient Hospital Stay (HOSPITAL_BASED_OUTPATIENT_CLINIC_OR_DEPARTMENT_OTHER): Admission: RE | Admit: 2020-12-26 | Payer: Medicare HMO | Source: Ambulatory Visit

## 2020-12-28 NOTE — Progress Notes (Signed)
Remote ICD transmission.   

## 2021-03-07 ENCOUNTER — Ambulatory Visit: Payer: Medicare HMO | Admitting: Cardiology

## 2021-05-19 ENCOUNTER — Other Ambulatory Visit: Payer: Self-pay | Admitting: Cardiology

## 2021-06-28 ENCOUNTER — Other Ambulatory Visit: Payer: Self-pay | Admitting: Cardiology

## 2021-11-13 ENCOUNTER — Other Ambulatory Visit: Payer: Self-pay | Admitting: Cardiology

## 2021-11-27 ENCOUNTER — Other Ambulatory Visit: Payer: Self-pay | Admitting: Cardiology

## 2021-12-17 ENCOUNTER — Ambulatory Visit (INDEPENDENT_AMBULATORY_CARE_PROVIDER_SITE_OTHER): Payer: Medicare HMO

## 2021-12-17 DIAGNOSIS — I5022 Chronic systolic (congestive) heart failure: Secondary | ICD-10-CM | POA: Diagnosis not present

## 2021-12-17 DIAGNOSIS — I255 Ischemic cardiomyopathy: Secondary | ICD-10-CM

## 2021-12-17 LAB — CUP PACEART REMOTE DEVICE CHECK
Battery Remaining Longevity: 84 mo
Battery Voltage: 3.01 V
Brady Statistic AP VP Percent: 87.93 %
Brady Statistic AP VS Percent: 0.65 %
Brady Statistic AS VP Percent: 11.18 %
Brady Statistic AS VS Percent: 0.24 %
Brady Statistic RA Percent Paced: 86.96 %
Brady Statistic RV Percent Paced: 89.71 %
Date Time Interrogation Session: 20221226012504
HighPow Impedance: 73 Ohm
Implantable Lead Implant Date: 20210825
Implantable Lead Implant Date: 20210825
Implantable Lead Implant Date: 20210825
Implantable Lead Location: 753858
Implantable Lead Location: 753859
Implantable Lead Location: 753860
Implantable Lead Model: 4398
Implantable Lead Model: 5076
Implantable Pulse Generator Implant Date: 20210825
Lead Channel Impedance Value: 184.154
Lead Channel Impedance Value: 191.854
Lead Channel Impedance Value: 191.854
Lead Channel Impedance Value: 208.568
Lead Channel Impedance Value: 208.568
Lead Channel Impedance Value: 342 Ohm
Lead Channel Impedance Value: 399 Ohm
Lead Channel Impedance Value: 437 Ohm
Lead Channel Impedance Value: 437 Ohm
Lead Channel Impedance Value: 456 Ohm
Lead Channel Impedance Value: 513 Ohm
Lead Channel Impedance Value: 532 Ohm
Lead Channel Impedance Value: 532 Ohm
Lead Channel Impedance Value: 646 Ohm
Lead Channel Impedance Value: 665 Ohm
Lead Channel Impedance Value: 703 Ohm
Lead Channel Impedance Value: 722 Ohm
Lead Channel Impedance Value: 722 Ohm
Lead Channel Pacing Threshold Amplitude: 0.5 V
Lead Channel Pacing Threshold Amplitude: 0.625 V
Lead Channel Pacing Threshold Amplitude: 1.375 V
Lead Channel Pacing Threshold Pulse Width: 0.4 ms
Lead Channel Pacing Threshold Pulse Width: 0.4 ms
Lead Channel Pacing Threshold Pulse Width: 0.4 ms
Lead Channel Sensing Intrinsic Amplitude: 2.75 mV
Lead Channel Sensing Intrinsic Amplitude: 2.75 mV
Lead Channel Sensing Intrinsic Amplitude: 25.625 mV
Lead Channel Sensing Intrinsic Amplitude: 25.625 mV
Lead Channel Setting Pacing Amplitude: 1.5 V
Lead Channel Setting Pacing Amplitude: 2 V
Lead Channel Setting Pacing Amplitude: 2 V
Lead Channel Setting Pacing Pulse Width: 0.4 ms
Lead Channel Setting Pacing Pulse Width: 0.4 ms
Lead Channel Setting Sensing Sensitivity: 0.3 mV

## 2021-12-27 NOTE — Progress Notes (Signed)
Remote ICD transmission.   

## 2022-01-10 ENCOUNTER — Other Ambulatory Visit: Payer: Self-pay

## 2022-01-10 ENCOUNTER — Other Ambulatory Visit: Payer: Self-pay | Admitting: Cardiology

## 2022-01-10 MED ORDER — MEXILETINE HCL 250 MG PO CAPS: 250.0000 mg | ORAL_CAPSULE | Freq: Two times a day (BID) | ORAL | 0 refills | Status: AC

## 2022-01-13 ENCOUNTER — Telehealth: Payer: Self-pay | Admitting: Cardiology

## 2022-01-13 NOTE — Telephone Encounter (Signed)
Patient's wife is requesting that we transfer the patient's ICD care to Dr. Rossie Muskrat of [facility]. She is requesting for patient to be completely released from Dr. Elberta Fortis if possible. Please advise.

## 2022-01-13 NOTE — Telephone Encounter (Signed)
Transfer request by [facility] accepted.

## 2022-01-14 ENCOUNTER — Other Ambulatory Visit: Payer: Self-pay | Admitting: Cardiology

## 2022-02-17 ENCOUNTER — Other Ambulatory Visit: Payer: Self-pay | Admitting: Cardiology

## 2022-05-28 ENCOUNTER — Other Ambulatory Visit: Payer: Self-pay | Admitting: Cardiology

## 2022-06-23 ENCOUNTER — Other Ambulatory Visit: Payer: Self-pay | Admitting: Cardiology

## 2022-06-28 ENCOUNTER — Other Ambulatory Visit: Payer: Self-pay | Admitting: Cardiology

## 2022-08-28 ENCOUNTER — Other Ambulatory Visit: Payer: Self-pay | Admitting: Cardiology

## 2022-08-28 NOTE — Telephone Encounter (Signed)
Refill denied, patient followed by another provider with [facility]

## 2022-10-22 ENCOUNTER — Other Ambulatory Visit: Payer: Self-pay | Admitting: Cardiology

## 2022-11-05 ENCOUNTER — Other Ambulatory Visit: Payer: Self-pay | Admitting: Cardiology

## 2023-01-24 ENCOUNTER — Other Ambulatory Visit: Payer: Self-pay | Admitting: Cardiology
